# Patient Record
Sex: Male | Born: 1960 | Race: Black or African American | Hispanic: No | Marital: Married | Smoking: Former smoker
Health system: Southern US, Community
[De-identification: ages and names within clinical notes are randomized; demographics above are authoritative.]

## PROBLEM LIST (undated history)

## (undated) DIAGNOSIS — I82409 Acute embolism and thrombosis of unspecified deep veins of unspecified lower extremity: Secondary | ICD-10-CM

## (undated) DIAGNOSIS — K635 Polyp of colon: Secondary | ICD-10-CM

## (undated) DIAGNOSIS — I10 Essential (primary) hypertension: Secondary | ICD-10-CM

## (undated) DIAGNOSIS — A159 Respiratory tuberculosis unspecified: Secondary | ICD-10-CM

## (undated) DIAGNOSIS — K625 Hemorrhage of anus and rectum: Secondary | ICD-10-CM

## (undated) DIAGNOSIS — E785 Hyperlipidemia, unspecified: Secondary | ICD-10-CM

## (undated) HISTORY — DX: Essential (primary) hypertension: I10

## (undated) HISTORY — DX: Hemorrhage of anus and rectum: K62.5

## (undated) HISTORY — PX: PILONIDAL CYST EXCISION: SHX744

## (undated) SURGERY — Surgical Case
Anesthesia: *Unknown

---

## 2007-02-25 ENCOUNTER — Emergency Department (HOSPITAL_COMMUNITY): Admission: EM | Admit: 2007-02-25 | Discharge: 2007-02-25 | Payer: Self-pay | Admitting: Emergency Medicine

## 2011-08-08 ENCOUNTER — Other Ambulatory Visit (INDEPENDENT_AMBULATORY_CARE_PROVIDER_SITE_OTHER): Payer: Self-pay | Admitting: General Surgery

## 2011-08-08 ENCOUNTER — Ambulatory Visit (INDEPENDENT_AMBULATORY_CARE_PROVIDER_SITE_OTHER): Payer: BC Managed Care – PPO | Admitting: General Surgery

## 2011-08-08 ENCOUNTER — Encounter (INDEPENDENT_AMBULATORY_CARE_PROVIDER_SITE_OTHER): Payer: Self-pay | Admitting: General Surgery

## 2011-08-08 VITALS — BP 150/96 | HR 64 | Temp 97.8°F | Resp 12 | Ht 71.5 in | Wt 208.4 lb

## 2011-08-08 DIAGNOSIS — K4090 Unilateral inguinal hernia, without obstruction or gangrene, not specified as recurrent: Secondary | ICD-10-CM

## 2011-08-08 NOTE — Progress Notes (Signed)
Chief Complaint  Patient presents with  . Other    new pt- eval [facility]    HPI Joseph [name] is a 50 y.o. male.  Referred by Dr. Shanda Bumps [name] HPI [name] who is otherwise healthy except for he was started on some medication for hypertension recently. He comes in today after several weeks of some left groin pain and swelling. This is worse when he sneezes or coughs. This always goes away over some time. He has no trouble with his bowels although he does say that he has some occasional bright red blood per rectum. He has a history of a what sounds like a pilonidal cyst. He has not had a colonoscopy before he has no weight loss or any other symptoms.  Past Medical History  Diagnosis Date  . Hypertension   . Inguinal hernia     LIH  . Rectal bleeding   . Pilonidal cyst     Past Surgical History  Procedure Date  . Pilonidal cyst excision     History reviewed. No pertinent family history.  Social History History  Substance Use Topics  . Smoking status: Current Everyday Smoker -- 1.0 packs/day  . Smokeless tobacco: Not on file  . Alcohol Use: No    No Known Allergies  Current Outpatient Prescriptions  Medication Sig Dispense Refill  . LISINOPRIL-HYDROCHLOROTHIAZIDE PO Take by mouth daily.          Review of Systems Review of Systems  Gastrointestinal: Positive for blood in stool.  All other systems reviewed and are negative.    Blood pressure 150/96, pulse 64, temperature 97.8 F (36.6 C), temperature source Temporal, resp. rate 12, height 5' 11.5" (1.816 m), weight 208 lb 6.4 oz (94.53 kg).  Physical Exam Physical Exam  Constitutional: He appears well-developed and well-nourished.  Eyes: No scleral icterus.  Neck: Neck supple.  Cardiovascular: Normal rate, regular rhythm and normal heart sounds.   Pulmonary/Chest: Effort normal and breath sounds normal. He has no wheezes. He has no rales.  Abdominal: Soft. There is no tenderness. A hernia  is present. Hernia confirmed positive in the left inguinal area. Hernia confirmed negative in the ventral area and confirmed negative in the right inguinal area.  Genitourinary: Testes normal. Right testis shows no mass. Left testis shows no mass.     Assessment    Left inguinal hernia    Plan    We discussed observation versus repair.  We discussed an open inguinal hernia repair. I described the procedure in detail.  The patient was given educational material.  Goals should be achieved with surgery. We discussed the usage of mesh and the rationale behind that. We went over the pathophysiology of inguinal hernia. We have elected to perform open inguinal hernia repair with mesh.  We discussed the risks including bleeding, infection, recurrence, postoperative pain and chronic groin pain, testicular injury, urinary retention, numbness in groin and around incision.  I also recommended he undergo colonoscopy and we discussed doing this before the hernia repair. He wants to do hernia repair first.  I will refer to GI when done.       Joseph [name] 08/08/2011, 2:22 PM

## 2011-08-25 ENCOUNTER — Encounter (HOSPITAL_COMMUNITY): Payer: Self-pay

## 2011-08-25 ENCOUNTER — Encounter (HOSPITAL_COMMUNITY)
Admission: RE | Admit: 2011-08-25 | Discharge: 2011-08-25 | Disposition: A | Discharge status: Home / Self Care | Payer: BC Managed Care – PPO | Source: Ambulatory Visit | Attending: General Surgery | Admitting: General Surgery

## 2011-08-25 ENCOUNTER — Other Ambulatory Visit: Payer: Self-pay

## 2011-08-25 ENCOUNTER — Encounter (HOSPITAL_COMMUNITY)
Admission: RE | Admit: 2011-08-25 | Discharge: 2011-08-25 | Disposition: A | Discharge status: Home / Self Care | Payer: BC Managed Care – PPO | Source: Ambulatory Visit | Attending: Anesthesiology | Admitting: Anesthesiology

## 2011-08-25 HISTORY — DX: Respiratory tuberculosis unspecified: A15.9

## 2011-08-25 LAB — CBC
Hemoglobin: 13 g/dL (ref 13.0–17.0)
MCH: 29.7 pg (ref 26.0–34.0)
MCV: 92.2 fL (ref 78.0–100.0)
RBC: 4.37 MIL/uL (ref 4.22–5.81)
WBC: 5.3 10*3/uL (ref 4.0–10.5)

## 2011-08-25 LAB — BASIC METABOLIC PANEL
CO2: 29 mEq/L (ref 19–32)
GFR calc non Af Amer: >90 mL/min (ref >90)
Glucose, Bld: 96 mg/dL (ref 70–99)
Potassium: 3.9 mEq/L (ref 3.5–5.1)
Sodium: 141 mEq/L (ref 135–145)

## 2011-08-25 LAB — SURGICAL PCR SCREEN: MRSA, PCR: NEGATIVE

## 2011-08-25 MED ORDER — CEFAZOLIN SODIUM-DEXTROSE 2-3 GM-% IV SOLR: 2.0000 g | INTRAVENOUS | Status: DC

## 2011-08-25 NOTE — Pre-Procedure Instructions (Signed)
20 Joseph [name]  08/25/2011   Your procedure is scheduled on:  11/06/10  Report to [facility] at 530AM.  Call this number if you have problems the morning of surgery: [phone]   Remember:   Do not eat food:After Midnight.  Do not drink clear liquids: 4 Hours before arrival.  Take these medicines the morning of surgery with A SIP OF WATER: none   Do not wear jewelry, make-up or nail polish.  Do not wear lotions, powders, or perfumes. You may wear deodorant.  Do not shave 48 hours prior to surgery.  Do not bring valuables to the hospital.  Contacts, dentures or bridgework may not be worn into surgery.  Leave suitcase in the car. After surgery it may be brought to your room.  For patients admitted to the hospital, checkout time is 11:00 AM the day of discharge.   Patients discharged the day of surgery will not be allowed to drive home.  Name and phone number of your driver: dtr  [email or web address]  Special Instructions: CHG Shower Use Special Wash: 1/2 bottle night before surgery and 1/2 bottle morning of surgery.   Please read over the following fact sheets that you were given: Pain Booklet, Coughing and Deep Breathing, MRSA Information and Surgical Site Infection Prevention

## 2011-09-06 MED ORDER — CEFAZOLIN SODIUM-DEXTROSE 2-3 GM-% IV SOLR
2.0000 g | INTRAVENOUS | Status: DC
  Filled 2011-09-06: qty 50

## 2011-09-07 ENCOUNTER — Encounter (HOSPITAL_COMMUNITY)
Admission: RE | Disposition: A | Discharge status: Home / Self Care | Payer: Self-pay | Source: Ambulatory Visit | Attending: General Surgery

## 2011-09-07 ENCOUNTER — Ambulatory Visit (HOSPITAL_COMMUNITY)
Admission: RE | Admit: 2011-09-07 | Discharge: 2011-09-07 | Disposition: A | Discharge status: Home / Self Care | Payer: BC Managed Care – PPO | Source: Ambulatory Visit | Attending: General Surgery | Admitting: General Surgery

## 2011-09-07 ENCOUNTER — Encounter (HOSPITAL_COMMUNITY): Payer: Self-pay | Admitting: Anesthesiology

## 2011-09-07 ENCOUNTER — Encounter (HOSPITAL_COMMUNITY): Payer: Self-pay | Admitting: *Deleted

## 2011-09-07 ENCOUNTER — Ambulatory Visit (HOSPITAL_COMMUNITY): Payer: BC Managed Care – PPO | Admitting: Anesthesiology

## 2011-09-07 DIAGNOSIS — K4090 Unilateral inguinal hernia, without obstruction or gangrene, not specified as recurrent: Secondary | ICD-10-CM

## 2011-09-07 DIAGNOSIS — J45909 Unspecified asthma, uncomplicated: Secondary | ICD-10-CM | POA: Insufficient documentation

## 2011-09-07 DIAGNOSIS — F17200 Nicotine dependence, unspecified, uncomplicated: Secondary | ICD-10-CM | POA: Insufficient documentation

## 2011-09-07 DIAGNOSIS — K921 Melena: Secondary | ICD-10-CM | POA: Insufficient documentation

## 2011-09-07 DIAGNOSIS — I10 Essential (primary) hypertension: Secondary | ICD-10-CM | POA: Insufficient documentation

## 2011-09-07 HISTORY — PX: INGUINAL HERNIA REPAIR: SHX194

## 2011-09-07 SURGERY — REPAIR, HERNIA, INGUINAL, ADULT
Anesthesia: General | Site: Groin | Laterality: Left | Wound class: Clean

## 2011-09-07 MED ORDER — MORPHINE SULFATE 2 MG/ML IJ SOLN: 2.0000 mg | INTRAMUSCULAR | Status: DC | PRN

## 2011-09-07 MED ORDER — CEFAZOLIN SODIUM 1-5 GM-% IV SOLN
INTRAVENOUS | Status: DC | PRN
  Administered 2011-09-07: 2 g via INTRAVENOUS

## 2011-09-07 MED ORDER — OXYCODONE-ACETAMINOPHEN 5-325 MG PO TABS: 1.0000 | ORAL_TABLET | ORAL | Status: DC | PRN

## 2011-09-07 MED ORDER — SODIUM CHLORIDE 0.9 % IJ SOLN
INTRAMUSCULAR | Status: DC | PRN
  Administered 2011-09-07: 20 mL

## 2011-09-07 MED ORDER — BUPIVACAINE LIPOSOME 1.3 % IJ SUSP
INTRAMUSCULAR | Status: DC | PRN
  Administered 2011-09-07: 20 mL

## 2011-09-07 MED ORDER — ONDANSETRON HCL 4 MG/2ML IJ SOLN: 4.0000 mg | Freq: Once | INTRAMUSCULAR | Status: DC | PRN

## 2011-09-07 MED ORDER — HYDROMORPHONE HCL PF 1 MG/ML IJ SOLN
INTRAMUSCULAR | Status: AC
  Filled 2011-09-07: qty 1

## 2011-09-07 MED ORDER — SODIUM CHLORIDE 0.9 % IV SOLN: INTRAVENOUS | Status: DC

## 2011-09-07 MED ORDER — MIDAZOLAM HCL 5 MG/5ML IJ SOLN
INTRAMUSCULAR | Status: DC | PRN
  Administered 2011-09-07: 2 mg via INTRAVENOUS

## 2011-09-07 MED ORDER — ONDANSETRON HCL 4 MG/2ML IJ SOLN
4.0000 mg | Freq: Once | INTRAMUSCULAR | Status: AC
  Administered 2011-09-07: 4 mg via INTRAVENOUS

## 2011-09-07 MED ORDER — SODIUM CHLORIDE 0.9 % IR SOLN
Status: DC | PRN
  Administered 2011-09-07: 1

## 2011-09-07 MED ORDER — BUPIVACAINE LIPOSOME 1.3 % IJ SUSP
20.0000 mL | INTRAMUSCULAR | Status: DC
  Filled 2011-09-07 (×2): qty 20

## 2011-09-07 MED ORDER — ROCURONIUM BROMIDE 100 MG/10ML IV SOLN
INTRAVENOUS | Status: DC | PRN
  Administered 2011-09-07: 50 mg via INTRAVENOUS

## 2011-09-07 MED ORDER — HYDROMORPHONE HCL PF 1 MG/ML IJ SOLN
0.2500 mg | INTRAMUSCULAR | Status: DC | PRN
  Administered 2011-09-07 (×3): 0.5 mg via INTRAVENOUS

## 2011-09-07 MED ORDER — OXYCODONE-ACETAMINOPHEN 5-325 MG PO TABS: 1.0000 | ORAL_TABLET | ORAL | Status: AC | PRN

## 2011-09-07 MED ORDER — NEOSTIGMINE METHYLSULFATE 1 MG/ML IJ SOLN
INTRAMUSCULAR | Status: DC | PRN
  Administered 2011-09-07: 3 mg via INTRAVENOUS

## 2011-09-07 MED ORDER — FENTANYL CITRATE 0.05 MG/ML IJ SOLN
INTRAMUSCULAR | Status: DC | PRN
  Administered 2011-09-07: 150 ug via INTRAVENOUS
  Administered 2011-09-07 (×2): 50 ug via INTRAVENOUS

## 2011-09-07 MED ORDER — PROPOFOL 10 MG/ML IV EMUL
INTRAVENOUS | Status: DC | PRN
  Administered 2011-09-07: 300 mg via INTRAVENOUS

## 2011-09-07 MED ORDER — HYDROMORPHONE HCL PF 1 MG/ML IJ SOLN: 0.2500 mg | INTRAMUSCULAR | Status: DC | PRN

## 2011-09-07 MED ORDER — GLYCOPYRROLATE 0.2 MG/ML IJ SOLN
INTRAMUSCULAR | Status: DC | PRN
  Administered 2011-09-07: .4 mg via INTRAVENOUS

## 2011-09-07 MED ORDER — LACTATED RINGERS IV SOLN
INTRAVENOUS | Status: DC | PRN
  Administered 2011-09-07: 07:00:00 via INTRAVENOUS

## 2011-09-07 MED ORDER — ONDANSETRON HCL 4 MG/2ML IJ SOLN
4.0000 mg | Freq: Once | INTRAMUSCULAR | Status: DC | PRN
  Filled 2011-09-07: qty 2

## 2011-09-07 MED ORDER — ONDANSETRON HCL 4 MG/2ML IJ SOLN
INTRAMUSCULAR | Status: DC | PRN
  Administered 2011-09-07: 4 mg via INTRAVENOUS

## 2011-09-07 MED ORDER — SODIUM CHLORIDE 0.9 % IJ SOLN
3.0000 mL | INTRAMUSCULAR | Status: DC | PRN
  Administered 2011-09-07: 3 mL via INTRAVENOUS

## 2011-09-07 SURGICAL SUPPLY — 49 items
ADH SKN CLS APL DERMABOND .7 (GAUZE/BANDAGES/DRESSINGS)
BLADE SURG 10 STRL SS (BLADE) ×2 IMPLANT
BLADE SURG 15 STRL LF DISP TIS (BLADE) ×1 IMPLANT
BLADE SURG 15 STRL SS (BLADE) ×2
BLADE SURG ROTATE 9660 (MISCELLANEOUS) ×2 IMPLANT
CHLORAPREP W/TINT 26ML (MISCELLANEOUS) ×2 IMPLANT
CLOTH BEACON ORANGE TIMEOUT ST (SAFETY) ×2 IMPLANT
COVER SURGICAL LIGHT HANDLE (MISCELLANEOUS) ×2 IMPLANT
DERMABOND ADVANCED (GAUZE/BANDAGES/DRESSINGS)
DERMABOND ADVANCED .7 DNX12 (GAUZE/BANDAGES/DRESSINGS) ×1 IMPLANT
DRAIN PENROSE 1/2X12 LTX STRL (WOUND CARE) ×1 IMPLANT
DRAPE LAPAROTOMY TRNSV 102X78 (DRAPE) ×2 IMPLANT
ELECT CAUTERY BLADE 6.4 (BLADE) ×2 IMPLANT
ELECT REM PT RETURN 9FT ADLT (ELECTROSURGICAL) ×2
ELECTRODE REM PT RTRN 9FT ADLT (ELECTROSURGICAL) ×1 IMPLANT
GAUZE SPONGE 4X4 16PLY XRAY LF (GAUZE/BANDAGES/DRESSINGS) ×2 IMPLANT
GLOVE BIO SURGEON STRL SZ7 (GLOVE) ×2 IMPLANT
GLOVE BIO SURGEON STRL SZ7.5 (GLOVE) ×2 IMPLANT
GLOVE BIOGEL PI IND STRL 7.0 (GLOVE) IMPLANT
GLOVE BIOGEL PI IND STRL 7.5 (GLOVE) ×1 IMPLANT
GLOVE BIOGEL PI INDICATOR 7.0 (GLOVE) ×1
GLOVE BIOGEL PI INDICATOR 7.5 (GLOVE) ×2
GLOVE SURG SS PI 6.5 STRL IVOR (GLOVE) ×2 IMPLANT
GOWN STRL NON-REIN LRG LVL3 (GOWN DISPOSABLE) ×5 IMPLANT
KIT BASIN OR (CUSTOM PROCEDURE TRAY) ×2 IMPLANT
KIT ROOM TURNOVER OR (KITS) ×2 IMPLANT
MESH HERNIA SYS ULTRAPRO LRG (Mesh General) ×1 IMPLANT
NDL HYPO 25GX1X1/2 BEV (NEEDLE) ×1 IMPLANT
NEEDLE HYPO 25GX1X1/2 BEV (NEEDLE) ×2 IMPLANT
NS IRRIG 1000ML POUR BTL (IV SOLUTION) ×2 IMPLANT
PACK SURGICAL SETUP 50X90 (CUSTOM PROCEDURE TRAY) ×2 IMPLANT
PAD ARMBOARD 7.5X6 YLW CONV (MISCELLANEOUS) ×2 IMPLANT
PENCIL BUTTON HOLSTER BLD 10FT (ELECTRODE) ×2 IMPLANT
SPONGE LAP 18X18 X RAY DECT (DISPOSABLE) ×2 IMPLANT
SUT MNCRL AB 4-0 PS2 18 (SUTURE) ×2 IMPLANT
SUT PROLENE 2 0 CT2 30 (SUTURE) ×5 IMPLANT
SUT VIC AB 0 CT1 27 (SUTURE)
SUT VIC AB 0 CT1 27XBRD ANBCTR (SUTURE) IMPLANT
SUT VIC AB 2-0 CT1 27 (SUTURE) ×4
SUT VIC AB 2-0 CT1 TAPERPNT 27 (SUTURE) ×2 IMPLANT
SUT VIC AB 3-0 SH 27 (SUTURE) ×2
SUT VIC AB 3-0 SH 27XBRD (SUTURE) ×1 IMPLANT
SUT VICRYL AB 2 0 TIES (SUTURE) ×2 IMPLANT
SYR CONTROL 10ML LL (SYRINGE) ×2 IMPLANT
TOWEL OR 17X24 6PK STRL BLUE (TOWEL DISPOSABLE) ×2 IMPLANT
TOWEL OR 17X26 10 PK STRL BLUE (TOWEL DISPOSABLE) ×2 IMPLANT
TUBE CONNECTING 12X1/4 (SUCTIONS) ×2 IMPLANT
WATER STERILE IRR 1000ML POUR (IV SOLUTION) IMPLANT
YANKAUER SUCT BULB TIP NO VENT (SUCTIONS) ×2 IMPLANT

## 2011-09-07 NOTE — Anesthesia Postprocedure Evaluation (Signed)
  Anesthesia Post-op Note  Patient: Joseph [name]  Procedure(s) Performed:  HERNIA REPAIR INGUINAL ADULT  Patient Location: PACU  Anesthesia Type: General  Level of Consciousness: awake, oriented, sedated and patient cooperative  Airway and Oxygen Therapy: Patient Spontanous Breathing and Patient connected to nasal cannula oxygen  Post-op Pain: mild  Post-op Assessment: Post-op Vital signs reviewed, Patient's Cardiovascular Status Stable, Respiratory Function Stable, Patent Airway, No signs of Nausea or vomiting and Pain level controlled  Post-op Vital Signs: stable  Complications: No apparent anesthesia complications

## 2011-09-07 NOTE — Transfer of Care (Signed)
Immediate Anesthesia Transfer of Care Note  Patient: Joseph [name]  Procedure(s) Performed:  HERNIA REPAIR INGUINAL ADULT  Patient Location: PACU  Anesthesia Type: General  Level of Consciousness: awake, alert  and oriented  Airway & Oxygen Therapy: Patient Spontanous Breathing and Patient connected to nasal cannula oxygen  Post-op Assessment: Report given to PACU RN and Post -op Vital signs reviewed and stable  Post vital signs: Reviewed and stable  Complications: No apparent anesthesia complications

## 2011-09-07 NOTE — Interval H&P Note (Signed)
History and Physical Interval Note:  09/07/2011 7:08 AM  Joseph [name]  has presented today for surgery, with the diagnosis of Left inguinal hernia  The various methods of treatment have been discussed with the patient and family. After consideration of risks, benefits and other options for treatment, the patient has consented to  Procedure(s): HERNIA REPAIR INGUINAL as a surgical intervention .  The patients' history has been reviewed, patient examined, no change in status, stable for surgery.  I have reviewed the patients' chart and labs.  Questions were answered to the patient's satisfaction.     Sahory Nordling

## 2011-09-07 NOTE — Preoperative (Signed)
Beta Blockers   Reason not to administer Beta Blockers:Not Applicable 

## 2011-09-07 NOTE — H&P (View-Only) (Signed)
Chief Complaint  Patient presents with  . Other    new pt- eval [facility]    HPI Joseph [name] is a 50 y.o. male.  Referred by Dr. Jessica Copland HPI This is a 50-year-old male who is otherwise healthy except for he was started on some medication for hypertension recently. He comes in today after several weeks of some left groin pain and swelling. This is worse when he sneezes or coughs. This always goes away over some time. He has no trouble with his bowels although he does say that he has some occasional bright red blood per rectum. He has a history of a what sounds like a pilonidal cyst. He has not had a colonoscopy before he has no weight loss or any other symptoms.  Past Medical History  Diagnosis Date  . Hypertension   . Inguinal hernia     LIH  . Rectal bleeding   . Pilonidal cyst     Past Surgical History  Procedure Date  . Pilonidal cyst excision     History reviewed. No pertinent family history.  Social History History  Substance Use Topics  . Smoking status: Current Everyday Smoker -- 1.0 packs/day  . Smokeless tobacco: Not on file  . Alcohol Use: No    No Known Allergies  Current Outpatient Prescriptions  Medication Sig Dispense Refill  . LISINOPRIL-HYDROCHLOROTHIAZIDE PO Take by mouth daily.          Review of Systems Review of Systems  Gastrointestinal: Positive for blood in stool.  All other systems reviewed and are negative.    Blood pressure 150/96, pulse 64, temperature 97.8 F (36.6 C), temperature source Temporal, resp. rate 12, height 5' 11.5" (1.816 m), weight 208 lb 6.4 oz (94.53 kg).  Physical Exam Physical Exam  Constitutional: He appears well-developed and well-nourished.  Eyes: No scleral icterus.  Neck: Neck supple.  Cardiovascular: Normal rate, regular rhythm and normal heart sounds.   Pulmonary/Chest: Effort normal and breath sounds normal. He has no wheezes. He has no rales.  Abdominal: Soft. There is no tenderness. A hernia  is present. Hernia confirmed positive in the left inguinal area. Hernia confirmed negative in the ventral area and confirmed negative in the right inguinal area.  Genitourinary: Testes normal. Right testis shows no mass. Left testis shows no mass.     Assessment    Left inguinal hernia    Plan    We discussed observation versus repair.  We discussed an open inguinal hernia repair. I described the procedure in detail.  The patient was given educational material.  Goals should be achieved with surgery. We discussed the usage of mesh and the rationale behind that. We went over the pathophysiology of inguinal hernia. We have elected to perform open inguinal hernia repair with mesh.  We discussed the risks including bleeding, infection, recurrence, postoperative pain and chronic groin pain, testicular injury, urinary retention, numbness in groin and around incision.  I also recommended he undergo colonoscopy and we discussed doing this before the hernia repair. He wants to do hernia repair first.  I will refer to GI when done.       Marcin Holte 08/08/2011, 2:22 PM    

## 2011-09-07 NOTE — Anesthesia Preprocedure Evaluation (Addendum)
Anesthesia Evaluation  Patient identified by MRN, date of birth, ID band Patient awake    Reviewed: Allergy & Precautions, H&P , NPO status , Patient's Chart, lab work & pertinent test results  History of Anesthesia Complications Negative for: history of anesthetic complications  Airway Mallampati: II TM Distance: >3 FB Neck ROM: full    Dental  (+) Teeth Intact and Dental Advidsory Given   Pulmonary asthma , Current Smoker,    Pulmonary exam normal       Cardiovascular Exercise Tolerance: Good hypertension, Pt. on medications regular Normal    Neuro/Psych Negative Neurological ROS  Negative Psych ROS   GI/Hepatic negative GI ROS, Neg liver ROS,   Endo/Other  Negative Endocrine ROS  Renal/GU negative Renal ROS  Genitourinary negative   Musculoskeletal negative musculoskeletal ROS (+)   Abdominal   Peds negative pediatric ROS (+)  Hematology negative hematology ROS (+)   Anesthesia Other Findings   Reproductive/Obstetrics negative OB ROS                        Anesthesia Physical Anesthesia Plan  ASA: II  Anesthesia Plan: General   Post-op Pain Management:    Induction: Intravenous  Airway Management Planned: Oral ETT  Additional Equipment:   Intra-op Plan:   Post-operative Plan: Extubation in OR  Informed Consent: I have reviewed the patients History and Physical, chart, labs and discussed the procedure including the risks, benefits and alternatives for the proposed anesthesia with the patient or authorized representative who has indicated his/her understanding and acceptance.   Dental advisory given and Dental Advisory Given  Plan Discussed with: Anesthesiologist, CRNA and Surgeon  Anesthesia Plan Comments:       Anesthesia Quick Evaluation

## 2011-09-07 NOTE — Op Note (Signed)
Preoperative diagnosis: Left inguinal hernia  Postoperative diagnosis: Left indirect inguinal hernia  Procedure: Left inguinal hernia repair with Ultra Pro hernia system  Surgeon: Dr. Harden Mo  Anesthesia: Gen Endotracheal  Specimens: None  Drains: None  Estimated blood loss: Minimal  Sponge and needle counts were correct x2 at the completion of the operation  Complications: None  Indications: [name] with a symptomatic left groin hernia. We discussed an open left inguinal hernia with mesh. The risks and benefits were discussed prior to beginning.  Procedure: After informed consent was obtained the patient was taken to the operating room. He was administered 1 g of intravenous cefazolin. Sequential compression devices were placed on his lower extremities prior to beginning the operation. He was then placed under general anesthesia without complication. His left groin was then prepped and draped in the standard sterile surgical fashion. A surgical timeout was then performed.  A left groin incision was made. The superficial epigastric vein was cauterized. This was taken down to his external abdominal oblique. This was opened through its external ring. His spermatic cord was then encircled with a Penrose drain. He had a weak floor but was noted to have a fairly large indirect hernia. There was a fairly large hernia sac as well as a cord lipoma associated with this. This was separated from the remainder of the cord structures preserving those in their entirety. These were then reduced inside the abdomen. The preperitoneal space was then developed with a Ray-Tec sponge. I then used an UltraPower hernia system and placed this into the preperitoneal space. The bottom portion of the bilayer was laid flat. I then closed the ring with 2-0 Vicryl suture. The top portion of the bilayer was then laid flat. At T-cut was made in the mesh and wrapped around the spermatic cord. I then  sutured this in numerous positions with 2-0 Prolene suture including to the pubic tubercle as well as to the shelving edge. The cheek it was tacked together as well as to the shelving edge. The lateral portion was laid flat under the external abdominal oblique. The mesh was in position and completely obliterated the myopectineal orifice. Hemostasis was observed. I then closed the external oblique with 2-0 Vicryl. Scarpa's fascia was closed with 3-0 Vicryl. The skin was closed with 4-0 Monocryl in a subcuticular fashion. I then infiltrated a total of 40 cc Exparel. Dermabond was then placed over the wound. His testicles in the scrotum at the completion of the operation. He tolerated this well, was extubated in the operating room, and transferred to the recovery room in stable condition.

## 2011-09-08 ENCOUNTER — Encounter (HOSPITAL_COMMUNITY): Payer: Self-pay | Admitting: General Surgery

## 2011-09-08 ENCOUNTER — Telehealth (INDEPENDENT_AMBULATORY_CARE_PROVIDER_SITE_OTHER): Payer: Self-pay | Admitting: General Surgery

## 2011-09-08 NOTE — Telephone Encounter (Signed)
This is Dr Doreen Salvage patient and is in his box awaiting his signature.

## 2011-09-08 NOTE — Telephone Encounter (Signed)
Message copied by Liliana Cline on Fri Sep 08, 2011  1:20 PM ------      Message from: Cathi Roan      Created: Fri Sep 08, 2011 11:58 AM      Regarding: FMLA paper work       Patient called with concerns about length of time it is taking to get his FMLA. Linda's notes say they have been in Dr. Tawana Scale box since 08/25/11

## 2011-10-12 ENCOUNTER — Encounter (INDEPENDENT_AMBULATORY_CARE_PROVIDER_SITE_OTHER): Payer: Self-pay | Admitting: General Surgery

## 2011-10-12 ENCOUNTER — Ambulatory Visit (INDEPENDENT_AMBULATORY_CARE_PROVIDER_SITE_OTHER): Payer: BC Managed Care – PPO | Admitting: General Surgery

## 2011-10-12 VITALS — BP 180/110 | HR 72 | Temp 97.6°F | Resp 16 | Ht 71.5 in | Wt 210.4 lb

## 2011-10-12 DIAGNOSIS — Z09 Encounter for follow-up examination after completed treatment for conditions other than malignant neoplasm: Secondary | ICD-10-CM

## 2011-10-12 NOTE — Progress Notes (Signed)
Subjective:     Patient ID: Joseph [name], male   DOB: June 25, 1961, 51 y.o.   MRN: [id number]  HPI This is a 51 year old male who I did a left inguinal hernia repair about a month ago now. He's doing well without any significant complaints. His back the most of his normal activities. He is due to go back to work on the 10th.  Review of Systems     Objective:   Physical Exam    well healed left groin incision without infection Assessment:     S/p [facility]H    Plan:      He is back to work on the 10th with restrictions for an additional 2 weeks. I told him that the scar will smooth out over some time and that his discomfort that he has occasionally will also get better. Ice and to call me back if he has any other questions otherwise I will see him as needed.

## 2016-05-26 ENCOUNTER — Encounter (HOSPITAL_BASED_OUTPATIENT_CLINIC_OR_DEPARTMENT_OTHER): Payer: Self-pay

## 2016-05-26 ENCOUNTER — Ambulatory Visit (HOSPITAL_BASED_OUTPATIENT_CLINIC_OR_DEPARTMENT_OTHER): Admission: RE | Admit: 2016-05-26 | Payer: BC Managed Care – PPO | Source: Ambulatory Visit

## 2016-05-26 ENCOUNTER — Encounter: Payer: Self-pay | Admitting: Physician Assistant

## 2016-05-26 ENCOUNTER — Ambulatory Visit (HOSPITAL_BASED_OUTPATIENT_CLINIC_OR_DEPARTMENT_OTHER)
Admission: RE | Admit: 2016-05-26 | Discharge: 2016-05-26 | Disposition: A | Discharge status: Home / Self Care | Payer: BC Managed Care – PPO | Source: Ambulatory Visit | Attending: Physician Assistant | Admitting: Physician Assistant

## 2016-05-26 ENCOUNTER — Encounter (HOSPITAL_BASED_OUTPATIENT_CLINIC_OR_DEPARTMENT_OTHER): Admission: RE | Admit: 2016-05-26 | Payer: BC Managed Care – PPO | Source: Ambulatory Visit

## 2016-05-26 ENCOUNTER — Ambulatory Visit (INDEPENDENT_AMBULATORY_CARE_PROVIDER_SITE_OTHER): Payer: BC Managed Care – PPO | Admitting: Physician Assistant

## 2016-05-26 VITALS — BP 214/120 | HR 70 | Temp 97.8°F | Resp 16 | Ht 70.0 in | Wt 210.6 lb

## 2016-05-26 DIAGNOSIS — I10 Essential (primary) hypertension: Secondary | ICD-10-CM

## 2016-05-26 DIAGNOSIS — M160 Bilateral primary osteoarthritis of hip: Secondary | ICD-10-CM | POA: Diagnosis not present

## 2016-05-26 DIAGNOSIS — I700 Atherosclerosis of aorta: Secondary | ICD-10-CM | POA: Diagnosis not present

## 2016-05-26 DIAGNOSIS — R1030 Lower abdominal pain, unspecified: Secondary | ICD-10-CM

## 2016-05-26 DIAGNOSIS — R1032 Left lower quadrant pain: Secondary | ICD-10-CM

## 2016-05-26 DIAGNOSIS — N281 Cyst of kidney, acquired: Secondary | ICD-10-CM | POA: Diagnosis not present

## 2016-05-26 DIAGNOSIS — Z0000 Encounter for general adult medical examination without abnormal findings: Secondary | ICD-10-CM | POA: Diagnosis not present

## 2016-05-26 DIAGNOSIS — R079 Chest pain, unspecified: Secondary | ICD-10-CM

## 2016-05-26 DIAGNOSIS — R0602 Shortness of breath: Secondary | ICD-10-CM | POA: Diagnosis not present

## 2016-05-26 DIAGNOSIS — K429 Umbilical hernia without obstruction or gangrene: Secondary | ICD-10-CM | POA: Diagnosis not present

## 2016-05-26 LAB — POCT CBC
GRANULOCYTE PERCENT: 47.9 % (ref 37–80)
HEMATOCRIT: 38.8 % — AB (ref 43.5–53.7)
Hemoglobin: 13.6 g/dL — AB (ref 14.1–18.1)
LYMPH, POC: 2 (ref 0.6–3.4)
MCH, POC: 30.5 pg (ref 27–31.2)
MCHC: 35 g/dL (ref 31.8–35.4)
MCV: 87 fL (ref 80–97)
MID (CBC): 0.2 (ref 0–0.9)
MPV: 8.8 fL (ref 0–99.8)
POC GRANULOCYTE: 2.1 (ref 2–6.9)
POC LYMPH %: 46.3 % (ref 10–50)
POC MID %: 5.8 %M (ref 0–12)
Platelet Count, POC: 187 10*3/uL (ref 142–424)
RBC: 4.46 M/uL — AB (ref 4.69–6.13)
RDW, POC: 12.9 %
WBC: 4.3 10*3/uL — AB (ref 4.6–10.2)

## 2016-05-26 LAB — POCT URINALYSIS DIP (MANUAL ENTRY)
BILIRUBIN UA: NEGATIVE
Bilirubin, UA: NEGATIVE
Blood, UA: NEGATIVE
GLUCOSE UA: NEGATIVE
LEUKOCYTES UA: NEGATIVE
Nitrite, UA: NEGATIVE
PROTEIN UA: NEGATIVE
SPEC GRAV UA: 1.025
UROBILINOGEN UA: 0.2
pH, UA: 5.5

## 2016-05-26 LAB — POC MICROSCOPIC URINALYSIS (UMFC): MUCUS RE: ABSENT

## 2016-05-26 LAB — GLUCOSE, POCT (MANUAL RESULT ENTRY): POC Glucose: 101 mg/dl — AB (ref 70–99)

## 2016-05-26 MED ORDER — LISINOPRIL-HYDROCHLOROTHIAZIDE 20-12.5 MG PO TABS: 1.0000 | ORAL_TABLET | Freq: Every day | ORAL | 1 refills | Status: DC

## 2016-05-26 MED ORDER — IOPAMIDOL (ISOVUE-300) INJECTION 61%
100.0000 mL | Freq: Once | INTRAVENOUS | Status: AC | PRN
  Administered 2016-05-26: 100 mL via INTRAVENOUS

## 2016-05-26 NOTE — Patient Instructions (Addendum)
Please report to [facility] located at [address] [place] [address] at 330pm. You may enter the building at the main entrance on the 1st floor.     Please await contact for cardiology consult.  I would like you to return in 2 weeks for recheck your blood pressure. I will contact you with the results of your ultrasound of your groin. DASH Eating Plan DASH stands for "Dietary Approaches to Stop Hypertension." The DASH eating plan is a healthy eating plan that has been shown to reduce high blood pressure (hypertension). Additional health benefits may include reducing the risk of type 2 diabetes mellitus, heart disease, and stroke. The DASH eating plan may also help with weight loss. WHAT DO I NEED TO KNOW ABOUT THE DASH EATING PLAN? For the DASH eating plan, you will follow these general guidelines:  Choose foods with a percent daily value for sodium of less than 5% (as listed on the food label).  Use salt-free seasonings or herbs instead of table salt or sea salt.  Check with your health care provider or pharmacist before using salt substitutes.  Eat lower-sodium products, often labeled as "lower sodium" or "no salt added."  Eat fresh foods.  Eat more vegetables, fruits, and low-fat dairy products.  Choose whole grains. Look for the word "whole" as the first word in the ingredient list.  Choose fish and skinless chicken or [place] more often than red meat. Limit fish, poultry, and meat to 6 oz (170 g) each day.  Limit sweets, desserts, sugars, and sugary drinks.  Choose heart-healthy fats.  Limit cheese to 1 oz (28 g) per day.  Eat more home-cooked food and less restaurant, buffet, and fast food.  Limit fried foods.  Cook foods using methods other than frying.  Limit canned vegetables. If you do use them, rinse them well to decrease the sodium.  When eating at a restaurant, ask that your food be prepared with less salt, or no salt if possible. WHAT FOODS  CAN I EAT? Seek help from a dietitian for individual calorie needs. Grains Whole grain or whole wheat bread. Brown rice. Whole grain or whole wheat pasta. Quinoa, bulgur, and whole grain cereals. Low-sodium cereals. Corn or whole wheat flour tortillas. Whole grain cornbread. Whole grain crackers. Low-sodium crackers. Vegetables Fresh or frozen vegetables (raw, steamed, roasted, or grilled). Low-sodium or reduced-sodium tomato and vegetable juices. Low-sodium or reduced-sodium tomato sauce and paste. Low-sodium or reduced-sodium canned vegetables.  Fruits All fresh, canned (in natural juice), or frozen fruits. Meat and Other Protein Products Ground beef (85% or leaner), grass-fed beef, or beef trimmed of fat. Skinless chicken or [place]. Ground chicken or [place]. Pork trimmed of fat. All fish and seafood. Eggs. Dried beans, peas, or lentils. Unsalted nuts and seeds. Unsalted canned beans. Dairy Low-fat dairy products, such as skim or 1% milk, 2% or reduced-fat cheeses, low-fat ricotta or cottage cheese, or plain low-fat yogurt. Low-sodium or reduced-sodium cheeses. Fats and Oils Tub margarines without trans fats. Light or reduced-fat mayonnaise and salad dressings (reduced sodium). Avocado. Safflower, olive, or canola oils. Natural peanut or almond butter. Other Unsalted popcorn and pretzels. The items listed above may not be a complete list of recommended foods or beverages. Contact your dietitian for more options. WHAT FOODS ARE NOT RECOMMENDED? Grains White bread. White pasta. White rice. Refined cornbread. Bagels and croissants. Crackers that contain trans fat. Vegetables Creamed or fried vegetables. Vegetables in a cheese sauce. Regular canned vegetables. Regular canned  tomato sauce and paste. Regular tomato and vegetable juices. Fruits Dried fruits. Canned fruit in light or heavy syrup. Fruit juice. Meat and Other Protein Products Fatty cuts of meat. Ribs, chicken wings, bacon, sausage,  bologna, salami, chitterlings, fatback, hot dogs, bratwurst, and packaged luncheon meats. Salted nuts and seeds. Canned beans with salt. Dairy Whole or 2% milk, cream, half-and-half, and cream cheese. Whole-fat or sweetened yogurt. Full-fat cheeses or blue cheese. Nondairy creamers and whipped toppings. Processed cheese, cheese spreads, or cheese curds. Condiments Onion and garlic salt, seasoned salt, table salt, and sea salt. Canned and packaged gravies. Worcestershire sauce. Tartar sauce. Barbecue sauce. Teriyaki sauce. Soy sauce, including reduced sodium. Steak sauce. Fish sauce. Oyster sauce. Cocktail sauce. Horseradish. Ketchup and mustard. Meat flavorings and tenderizers. Bouillon cubes. Hot sauce. Tabasco sauce. Marinades. Taco seasonings. Relishes. Fats and Oils Butter, stick margarine, lard, shortening, ghee, and bacon fat. Coconut, palm kernel, or palm oils. Regular salad dressings. Other Pickles and olives. Salted popcorn and pretzels. The items listed above may not be a complete list of foods and beverages to avoid. Contact your dietitian for more information. WHERE CAN I FIND MORE INFORMATION? National Heart, Lung, and Blood Institute: travelstabloid.com   This information is not intended to replace advice given to you by your health care provider. Make sure you discuss any questions you have with your health care provider.   Document Released: 09/14/2011 Document Revised: 10/16/2014 Document Reviewed: 07/30/2013 Elsevier Interactive Patient Education 2016 [employer].     IF you received an x-ray today, you will receive an invoice from [facility]. Please contact [facility] at [phone] with questions or concerns regarding your invoice.   IF you received labwork today, you will receive an invoice from [employer]. Please contact [facility] at [phone] with questions or concerns regarding your  invoice.   Our billing staff will not be able to assist you with questions regarding bills from these companies.  You will be contacted with the lab results as soon as they are available. The fastest way to get your results is to activate your My Chart account. Instructions are located on the last page of this paperwork. If you have not heard from [place] regarding the results in 2 weeks, please contact this office.

## 2016-05-26 NOTE — Progress Notes (Signed)
u

## 2016-05-26 NOTE — Progress Notes (Signed)
Patient ID: Joseph [name], male   DOB: September 29, 1961, 55 y.o.   MRN: [id number] [facility] [address] [place] [address] [phone]  Date:  05/26/2016   Name:  Joseph [name]   DOB:  25-May-1961   MRN:  [id number]  PCP:  No PCP Per Patient   By signing my name below, I, Ladene Artist, attest that this documentation has been prepared under the direction and in the presence of Ivar Drape, PA-C Electronically Signed: Ladene Artist, ED Scribe 05/26/2016 at 11:03 AM.  History of Present Illness:  [name] is a 55 y.o. male, with a h/o HTN, patient who presents to [facility] for an annual exam. Pt's triage BP was 198/122, repeat BP was 214/120. Pt has been prescribed lisinopril-HCTZ but admits that he is not complaint. He states that he drinks approximately 5-6 water bottles daily. He reports associated symptoms of frequent right-sided HAs, recurrent left sided chest pain in the morning while getting ready for work, intermittent and some blurred vision. He denies palpitations, skin changes, leg swelling, diarrhea, diaphoresis, nausea, tremors, dizziness, numbness/tingling. No h/o thyroid disease. Pt states that he does not consume canned foods or sodas often. 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 Pt has 2 step daughters.   Pt also presents with constant left-sided groin pain for the past 5 years since he had a left inguinal hernia repaired. He reports increased pain with bending, lifting and turning. Pt states that pain occasionally radiates into his left leg and testicles but is improved with lifting his testicles. He denies bladder incontinence, leaking and penile discharge.   Patient Active Problem List   Diagnosis Date Noted   Inguinal hernia unilateral, non-recurrent 08/08/2011    Past Medical History:  Diagnosis Date   Asthma    child    Hypertension    Inguinal hernia    LIH   Pilonidal cyst    Rectal bleeding    Tuberculosis    tb  dx 3-4    Past Surgical History:  Procedure Laterality Date   INGUINAL HERNIA REPAIR  09/07/2011   Procedure: HERNIA REPAIR INGUINAL ADULT;  Surgeon: Rolm Bookbinder, MD;  Location: [facility] OR;  Service: General;  Laterality: Left;   PILONIDAL CYST EXCISION      Social History  Substance Use Topics   Smoking status: Current Every Day Smoker    Packs/day: 0.50   Smokeless tobacco: Not on file   Alcohol use Yes    No family history on file.  No Known Allergies  Medication list has been reviewed and updated.  Current Outpatient Prescriptions on File Prior to Visit  Medication Sig Dispense Refill   LISINOPRIL-HYDROCHLOROTHIAZIDE PO Take by mouth daily.       No current facility-administered medications on file prior to visit.     Review of Systems  Constitutional: Negative for chills, diaphoresis and fever.  HENT: Negative for ear discharge, ear pain and sore throat.   Eyes: Negative for blurred vision and double vision.  Respiratory: Negative for cough, shortness of breath and wheezing.   Cardiovascular: Positive for chest pain. Negative for palpitations and leg swelling.  Gastrointestinal: Negative for diarrhea, nausea and vomiting.  Genitourinary: Negative for dysuria, frequency and hematuria.       +Groin pain  Skin: Negative for itching and rash.  Neurological: Positive for headaches. Negative for dizziness,  tingling and tremors.    Physical Examination: BP (!) 214/120    Pulse 70    Temp 97.8 F (36.6 C) (Oral)    Resp 16    Ht 5\' 10"  (1.778 m)    Wt 210 lb 9.6 oz (95.5 kg)    SpO2 99%    BMI 30.22 kg/m  Ideal Body Weight: @FLOWAMB [id number]  Physical Exam  Constitutional: He is oriented to person, place, and time. He appears well-developed and well-nourished. No distress.  HENT:  Head: Normocephalic and atraumatic.  Right Ear: Tympanic  membrane, external ear and ear canal normal.  Left Ear: Tympanic membrane, external ear and ear canal normal.  Eyes: Conjunctivae and EOM are normal. Pupils are equal, round, and reactive to light.  Cardiovascular: Normal rate, regular rhythm and normal pulses.  Exam reveals no gallop and no friction rub.   No murmur heard. Pulmonary/Chest: Effort normal and breath sounds normal. No respiratory distress. He has no wheezes. He has no rhonchi. He has no rales.  Abdominal: Soft. Bowel sounds are normal. He exhibits no distension and no mass. There is no tenderness.  Genitourinary: Penis normal.  Genitourinary Comments: Chaperone present. Tenderness at the L groin with some nodularity present. No erythema. Substantial unevenness where the testicle of the L side was much lower than R side however cremasteric reflex was intact.   Musculoskeletal: Normal range of motion. He exhibits no edema or tenderness.  Neurological: He is alert and oriented to person, place, and time. He displays normal reflexes.  Skin: Skin is warm and dry. He is not diaphoretic.  Psychiatric: He has a normal mood and affect. His behavior is normal.    Assessment and Plan: Joseph [name] is a 55 y.o. male who is here today for annual physical exam and left sided groin pain. CT unremarkable at this time to contribute to the current left sided symptoms.  At this time general surgery consult is warranted as initial pain is at the left groin consistent with hernia repair.  Spoke with radiology, who states that there was nothing consistent with scrotal pain.  Ultrasound was cancelled secondary to their suggestion, however this may be found to be needed.   Referral to cardiology advised at this time.   Benign essential HTN - Plan: EKG 12-Lead, POCT glucose (manual entry), Ambulatory referral to Cardiology, lisinopril-hydrochlorothiazide (PRINZIDE,ZESTORETIC) 20-12.5 MG tablet  SOB (shortness of breath) - Plan: EKG 12-Lead, POCT  glucose (manual entry), Ambulatory referral to Cardiology  Left groin pain - Plan: POCT CBC, POCT urinalysis dipstick, POCT Microscopic Urinalysis ([facility]), US Scrotum, [place] Art/Ven Flow Abd Pelv Doppler, CT Abdomen Pelvis W Contrast, CANCELED: US Pelvis Limited  Chest pain, unspecified chest pain type - Plan: EKG 12-Lead, POCT glucose (manual entry), Ambulatory referral to Cardiology, lisinopril-hydrochlorothiazide (PRINZIDE,ZESTORETIC) 20-12.5 MG tablet  Ivar Drape, PA-C [facility] 05/26/2016 11:02 AM

## 2016-05-29 ENCOUNTER — Telehealth: Payer: Self-pay

## 2016-05-29 NOTE — Telephone Encounter (Signed)
Left message for the patient to return my call.  The patient is scheduled for a ultrasound of the scrotum tomorrow, 05/30/16, at [facility] at 3:00pm.  Arrival time is 2:45pm.  If the patient needs to reschedule, he can contact the scheduling department at [phone].

## 2016-05-30 ENCOUNTER — Other Ambulatory Visit: Payer: Self-pay | Admitting: *Deleted

## 2016-05-30 ENCOUNTER — Telehealth: Payer: Self-pay | Admitting: *Deleted

## 2016-05-30 ENCOUNTER — Ambulatory Visit (HOSPITAL_COMMUNITY): Admission: RE | Admit: 2016-05-30 | Payer: BC Managed Care – PPO | Source: Ambulatory Visit

## 2016-05-30 DIAGNOSIS — R1032 Left lower quadrant pain: Secondary | ICD-10-CM

## 2016-05-30 DIAGNOSIS — Z9889 Other specified postprocedural states: Secondary | ICD-10-CM

## 2016-05-30 DIAGNOSIS — Z8719 Personal history of other diseases of the digestive system: Secondary | ICD-10-CM

## 2016-05-30 NOTE — Telephone Encounter (Signed)
Ultrasound canceled per [place].  Patient notified.

## 2016-05-30 NOTE — Telephone Encounter (Signed)
Patient notified that ultrasound was cancelled and he will be referred to general surgery.

## 2016-05-31 ENCOUNTER — Telehealth: Payer: Self-pay

## 2016-05-31 NOTE — Telephone Encounter (Signed)
An order was placed for a ultrasound of the scrotum for the patient; however, an additional order is needed in order to schedule an appointment.  Please add an order for [place] Art/Ven Flow Abd Pelv Doppler.  Thank you.

## 2016-05-31 NOTE — Telephone Encounter (Signed)
This test was cancelled. Joseph [name] did you mean this?

## 2016-06-01 NOTE — Telephone Encounter (Signed)
We will proceed to refer to general surgery.  sheketia said it was cancelled, so I assume this is what the patient wanted.  She contacted me via telephone while I was out of the office.  Nevertheless, we can refer him to general surgery following a hernia repair--for this chronicly painful area.  MRI was untelling for this left sided pain

## 2016-06-16 ENCOUNTER — Ambulatory Visit (INDEPENDENT_AMBULATORY_CARE_PROVIDER_SITE_OTHER): Payer: BC Managed Care – PPO | Admitting: Physician Assistant

## 2016-06-16 VITALS — BP 146/84 | HR 74 | Temp 98.1°F | Resp 18 | Ht 71.0 in | Wt 214.4 lb

## 2016-06-16 DIAGNOSIS — I10 Essential (primary) hypertension: Secondary | ICD-10-CM

## 2016-06-16 NOTE — Patient Instructions (Addendum)
[facility] is at [facility].  Please confirm your appointment.  You can call them at [phone] Call within the week I will have your lab results within the next 7-10 days.  The dash diet below is good to help watch the blood pressure. Please remember to check your bp twice per week, and report back to me and/or the cardiologist. DASH Eating Plan DASH stands for "Dietary Approaches to Stop Hypertension." The DASH eating plan is a healthy eating plan that has been shown to reduce high blood pressure (hypertension). Additional health benefits may include reducing the risk of type 2 diabetes mellitus, heart disease, and stroke. The DASH eating plan may also help with weight loss. WHAT DO I NEED TO KNOW ABOUT THE DASH EATING PLAN? For the DASH eating plan, you will follow these general guidelines:  Choose foods with a percent daily value for sodium of less than 5% (as listed on the food label).  Use salt-free seasonings or herbs instead of table salt or sea salt.  Check with your health care provider or pharmacist before using salt substitutes.  Eat lower-sodium products, often labeled as "lower sodium" or "no salt added."  Eat fresh foods.  Eat more vegetables, fruits, and low-fat dairy products.  Choose whole grains. Look for the word "whole" as the first word in the ingredient list.  Choose fish and skinless chicken or [place] more often than red meat. Limit fish, poultry, and meat to 6 oz (170 g) each day.  Limit sweets, desserts, sugars, and sugary drinks.  Choose heart-healthy fats.  Limit cheese to 1 oz (28 g) per day.  Eat more home-cooked food and less restaurant, buffet, and fast food.  Limit fried foods.  Cook foods using methods other than frying.  Limit canned vegetables. If you do use them, rinse them well to decrease the sodium.  When eating at a restaurant, ask that your food be prepared with less salt, or no salt if possible. WHAT FOODS CAN I  EAT? Seek help from a dietitian for individual calorie needs. Grains Whole grain or whole wheat bread. Brown rice. Whole grain or whole wheat pasta. Quinoa, bulgur, and whole grain cereals. Low-sodium cereals. Corn or whole wheat flour tortillas. Whole grain cornbread. Whole grain crackers. Low-sodium crackers. Vegetables Fresh or frozen vegetables (raw, steamed, roasted, or grilled). Low-sodium or reduced-sodium tomato and vegetable juices. Low-sodium or reduced-sodium tomato sauce and paste. Low-sodium or reduced-sodium canned vegetables.  Fruits All fresh, canned (in natural juice), or frozen fruits. Meat and Other Protein Products Ground beef (85% or leaner), grass-fed beef, or beef trimmed of fat. Skinless chicken or [place]. Ground chicken or [place]. Pork trimmed of fat. All fish and seafood. Eggs. Dried beans, peas, or lentils. Unsalted nuts and seeds. Unsalted canned beans. Dairy Low-fat dairy products, such as skim or 1% milk, 2% or reduced-fat cheeses, low-fat ricotta or cottage cheese, or plain low-fat yogurt. Low-sodium or reduced-sodium cheeses. Fats and Oils Tub margarines without trans fats. Light or reduced-fat mayonnaise and salad dressings (reduced sodium). Avocado. Safflower, olive, or canola oils. Natural peanut or almond butter. Other Unsalted popcorn and pretzels. The items listed above may not be a complete list of recommended foods or beverages. Contact your dietitian for more options. WHAT FOODS ARE NOT RECOMMENDED? Grains White bread. White pasta. White rice. Refined cornbread. Bagels and croissants. Crackers that contain trans fat. Vegetables Creamed or fried vegetables. Vegetables in a cheese sauce. Regular canned vegetables. Regular canned tomato sauce and paste.  Regular tomato and vegetable juices. Fruits Dried fruits. Canned fruit in light or heavy syrup. Fruit juice. Meat and Other Protein Products Fatty cuts of meat. Ribs, chicken wings, bacon, sausage,  bologna, salami, chitterlings, fatback, hot dogs, bratwurst, and packaged luncheon meats. Salted nuts and seeds. Canned beans with salt. Dairy Whole or 2% milk, cream, half-and-half, and cream cheese. Whole-fat or sweetened yogurt. Full-fat cheeses or blue cheese. Nondairy creamers and whipped toppings. Processed cheese, cheese spreads, or cheese curds. Condiments Onion and garlic salt, seasoned salt, table salt, and sea salt. Canned and packaged gravies. Worcestershire sauce. Tartar sauce. Barbecue sauce. Teriyaki sauce. Soy sauce, including reduced sodium. Steak sauce. Fish sauce. Oyster sauce. Cocktail sauce. Horseradish. Ketchup and mustard. Meat flavorings and tenderizers. Bouillon cubes. Hot sauce. Tabasco sauce. Marinades. Taco seasonings. Relishes. Fats and Oils Butter, stick margarine, lard, shortening, ghee, and bacon fat. Coconut, palm kernel, or palm oils. Regular salad dressings. Other Pickles and olives. Salted popcorn and pretzels. The items listed above may not be a complete list of foods and beverages to avoid. Contact your dietitian for more information. WHERE CAN I FIND MORE INFORMATION? National Heart, Lung, and Blood Institute: travelstabloid.com   This information is not intended to replace advice given to you by your health care provider. Make sure you discuss any questions you have with your health care provider.   Document Released: 09/14/2011 Document Revised: 10/16/2014 Document Reviewed: 07/30/2013 Elsevier Interactive Patient Education 2016 [employer].      IF you received an x-ray today, you will receive an invoice from [facility]. Please contact [facility] at [phone] with questions or concerns regarding your invoice.   IF you received labwork today, you will receive an invoice from [employer]. Please contact [facility] at [phone] with questions or concerns regarding your  invoice.   Our billing staff will not be able to assist you with questions regarding bills from these companies.  You will be contacted with the lab results as soon as they are available. The fastest way to get your results is to activate your My Chart account. Instructions are located on the last page of this paperwork. If you have not heard from [place] regarding the results in 2 weeks, please contact this office.

## 2016-06-16 NOTE — Progress Notes (Signed)
Patient ID: Joseph [name], male   DOB: 1960/12/18, 55 y.o.   MRN: [id number] [facility] [address], [place] [address] [phone]  By signing my name below, I, Essence Howell, attest that this documentation has been prepared under the direction and in the presence of Ivar Drape, PA-C Electronically Signed: Ladene Artist, ED Scribe 06/16/2016 at 5:43 PM.  Date:  06/16/2016   Name:  Joseph [name]   DOB:  July 07, 1961   MRN:  [id number]  PCP:  No PCP Per Patient   History of Present Illness:  Joseph [name] is a 55 y.o. male patient who presents to [facility] for a follow-up regarding HTN. Pt's triage BP: 180/90, repeat BP: 146/84. Pt states that he has been compliant with the medications but has not checked his BP at home. He has not changed his diet since his last visit. States he does not eat too much fried or canned foods but drinks 1-2 [employer] daily. He states that his HAs have resolved. He states that chest pain has improved but he still has intermittent deep, cramping chest pain. Pt reports 1 episode that occurred today while walking that lasted for approximately 20 minutes. He denies nausea, diaphoresis, dizziness during the episode. Pt believes that he has an upcoming appointment with [facility] on 9/13. He has an upcoming appointment with Dr. Donne Hazel on 9/29 to follow-up on his hernia repair. Pt has been managing his pain with ibuprofen.   Patient Active Problem List   Diagnosis Date Noted   Inguinal hernia unilateral, non-recurrent 08/08/2011    Past Medical History:  Diagnosis Date   Asthma    child   Hypertension    Inguinal hernia    LIH   Pilonidal cyst    Rectal bleeding    Tuberculosis    tb  dx 3-4    Past Surgical History:  Procedure Laterality Date   INGUINAL HERNIA REPAIR  09/07/2011   Procedure: HERNIA REPAIR INGUINAL ADULT;  Surgeon: Rolm Bookbinder, MD;  Location: [facility] OR;  Service:  General;  Laterality: Left;   PILONIDAL CYST EXCISION      Social History  Substance Use Topics   Smoking status: Current Every Day Smoker    Packs/day: 0.50   Smokeless tobacco: Not on file   Alcohol use Yes    No family history on file.  No Known Allergies  Medication list has been reviewed and updated.  Current Outpatient Prescriptions on File Prior to Visit  Medication Sig Dispense Refill   lisinopril-hydrochlorothiazide (PRINZIDE,ZESTORETIC) 20-12.5 MG tablet Take 1 tablet by mouth daily. 30 tablet 1   No current facility-administered medications on file prior to visit.     Review of Systems  Constitutional: Negative for diaphoresis.  Cardiovascular: Positive for chest pain (intermittent).  Gastrointestinal: Negative for nausea.  Neurological: Negative for dizziness.    Physical Examination: BP (!) 146/84    Pulse 74    Temp 98.1 F (36.7 C)    Resp 18    Ht 5\' 11"  (1.803 m)    Wt 214 lb 6.4 oz (97.3 kg)    SpO2 99%    BMI 29.90 kg/m  Ideal Body Weight: @FLOWAMB [id number]  Physical Exam  Constitutional: He is oriented to person, place, and time. He appears well-developed and well-nourished. No distress.  HENT:  Head: Normocephalic and atraumatic.  Eyes: Conjunctivae and EOM are normal. Pupils are equal, round, and reactive to light.  Cardiovascular: Normal rate, regular rhythm  and normal pulses.  Exam reveals no gallop and no friction rub.   No murmur heard. Pulmonary/Chest: Effort normal and breath sounds normal. No respiratory distress.  Neurological: He is alert and oriented to person, place, and time.  Skin: Skin is warm and dry. He is not diaphoretic.  Psychiatric: He has a normal mood and affect. His behavior is normal.    Assessment and Plan: Joseph [name] is a 55 y.o. male who is here today for Recheck of blood pressure. This appears to be improving. We are gathering the following labs. Advised to follow-up with [facility] vascular for  appointment. He was also given information about the-diet to follow. Also advised him to check his blood pressure twice per week.  Essential hypertension - Plan: COMPLETE METABOLIC PANEL WITH GFR, TSH, Lipid panel  Ivar Drape, PA-C [facility] 06/16/2016 5:43 PM

## 2016-06-17 LAB — COMPLETE METABOLIC PANEL WITH GFR
ALT: 18 U/L (ref 9–46)
AST: 18 U/L (ref 10–35)
Albumin: 4.6 g/dL (ref 3.6–5.1)
Alkaline Phosphatase: 77 U/L (ref 40–115)
BUN: 10 mg/dL (ref 7–25)
CHLORIDE: 105 mmol/L (ref 98–110)
CO2: 30 mmol/L (ref 20–31)
Calcium: 10.3 mg/dL (ref 8.6–10.3)
Creat: 1.12 mg/dL (ref 0.70–1.33)
GFR, Est African American: 86 mL/min (ref >=60)
GFR, Est Non African American: 74 mL/min (ref >=60)
GLUCOSE: 94 mg/dL (ref 65–99)
POTASSIUM: 4.3 mmol/L (ref 3.5–5.3)
SODIUM: 140 mmol/L (ref 135–146)
Total Bilirubin: 0.5 mg/dL (ref 0.2–1.2)
Total Protein: 8 g/dL (ref 6.1–8.1)

## 2016-06-17 LAB — TSH: TSH: 0.65 mIU/L (ref 0.40–4.50)

## 2016-06-17 LAB — LIPID PANEL
CHOL/HDL RATIO: 4.7 ratio (ref <=5.0)
Cholesterol: 184 mg/dL (ref 125–200)
HDL: 39 mg/dL — AB (ref >=40)
LDL CALC: 113 mg/dL (ref <130)
Triglycerides: 162 mg/dL — ABNORMAL HIGH (ref <150)
VLDL: 32 mg/dL — AB (ref <30)

## 2016-06-27 ENCOUNTER — Encounter: Payer: Self-pay | Admitting: Physician Assistant

## 2016-07-01 ENCOUNTER — Encounter: Payer: Self-pay | Admitting: Physician Assistant

## 2016-07-01 DIAGNOSIS — I700 Atherosclerosis of aorta: Secondary | ICD-10-CM | POA: Insufficient documentation

## 2016-07-01 DIAGNOSIS — I10 Essential (primary) hypertension: Secondary | ICD-10-CM | POA: Insufficient documentation

## 2016-07-01 DIAGNOSIS — R9431 Abnormal electrocardiogram [ECG] [EKG]: Secondary | ICD-10-CM | POA: Insufficient documentation

## 2016-07-01 DIAGNOSIS — F17200 Nicotine dependence, unspecified, uncomplicated: Secondary | ICD-10-CM | POA: Insufficient documentation

## 2016-07-01 DIAGNOSIS — Q6589 Other specified congenital deformities of hip: Secondary | ICD-10-CM | POA: Insufficient documentation

## 2016-07-01 DIAGNOSIS — E785 Hyperlipidemia, unspecified: Secondary | ICD-10-CM | POA: Insufficient documentation

## 2016-07-01 DIAGNOSIS — I739 Peripheral vascular disease, unspecified: Secondary | ICD-10-CM | POA: Insufficient documentation

## 2016-07-01 DIAGNOSIS — R0789 Other chest pain: Secondary | ICD-10-CM | POA: Insufficient documentation

## 2016-07-17 ENCOUNTER — Other Ambulatory Visit: Payer: Self-pay | Admitting: Physician Assistant

## 2016-07-17 DIAGNOSIS — R079 Chest pain, unspecified: Secondary | ICD-10-CM

## 2016-07-17 DIAGNOSIS — I10 Essential (primary) hypertension: Secondary | ICD-10-CM

## 2016-07-18 NOTE — Telephone Encounter (Signed)
06/2016 last ov and labs 

## 2016-07-23 NOTE — H&P (Signed)
OFFICE VISIT NOTES COPIED TO EPIC FOR DOCUMENTATION  . History of Present Illness Neldon Labella AGNP-C; 07/20/2016 9:27 AM) Patient words: Last O/V 06/21/2016 F/U for nuc, echo, le duplex.  The patient is a 55 year old male who presents for a Follow-up for Chest pain. He was recently seen by his PCP for a routine physical exam. Blood pressure was noted to be markedly elevated at 214/120. He was started on Lisinopril/HCTZ 20/12.5 mg. When questioned specifically, he admitted to intermittent episodes of sharp chest pain. He states this has been occurring for years without any exertional component and has never been that concerning to him. He denies any shortness of breath, exertional or otherwise. No history of diabetes or thyroid disease. He had a CT scan of his abdomen on 05/26/2016 for abdominal pain which revealed minimal atherosclerotic changes of the aorta and iliac arteries. He currently smokes one half a pack of cigarettes per day. Otherwise, denies any PND, orthopnea, edema, dizziness, syncope, or symptoms suggestive of claudication or TIA. No headaches or visual disturbances.   Problem List/Past Medical Anderson Malta Sergeant; 07/20/2016 9:12 AM) Tobacco use disorder (F17.200)  Aortic atherosclerosis (I70.0)  CT abdomen 05/26/2016: Minimal atherosclerotic calcification of the aorta and iliac arteries. Accelerated hypertension (I10)  Atypical chest pain (R07.89)  Exercise sestamibi stress test 07/03/2016: 1. The resting electrocardiogram demonstrated normal sinus rhythm, normal resting conduction and no resting arrhythmias. Borderline criteria for LVH. The stress electrocardiogram was normal. Patient exercised on Bruce protocol for 8.07 minutes and achieved 10.16 METS. Stress test terminated due to 88 % MPHR achieved (Target HR >85%). Symptoms included dyspnea. Normal exercise tolerence. 2. The left ventricle was mildly dilated both during rest images, LV end-diastolic volume Q000111Q  mL.Marland Kitchen Perfusion imaging study demonstrated uniform uptake of radioisotope without evidence of ischemia or scar. Left ventricular systolic function calculated by QGS was moderate to severely depressed at 38% however visually appeared to be at least low-normal. This is an intermediate risk study, clinical correlation recommended. Labwork  06/16/2016: Total cholesterol 184, triglycerides 162, HDL 39, LDL 113, TSH 0.65, creatinine 1.12, potassium 4.3 Mild hyperlipidemia (E78.5)  Abnormal EKG (R94.31)  Echocardiogram 07/12/2016: Poor apical window. Wall motion reduced sensitivity. Left ventricle cavity is normal in size. Moderate concentric hypertrophy of the left ventricle. Normal global wall motion. Doppler evidence of grade I (impaired) diastolic dysfunction. Calculated EF 55%. Mild (Grade I) mitral regurgitation. Trace tricuspid regurgitation. Unable to estimate PA pressure due to absence/minimal TR signal. 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 Significant velocity increase at the left distal superficial femoral artery suggests >50% stenosis. Proximal SFA shows monophasic waveforms suggestive of severe diffuse disease.  This exam reveals moderately decreased perfusion of the lower extremity with RABI 0.78 and LABI 0.56, noted at the post tibial artery level. Consider further w/u. Hypertension, essential, benign (I10)   Allergies Anderson Malta Sergeant; 07/20/2016 9:12 AM) No Known Drug Allergies 06/21/2016  Family History Anderson Malta Sergeant; 07/20/2016 9:12 AM) Mother  Living, no known heart conditions, htn Father  Deceased. unknown age at passing, no known history Sister 2  older, no known heart conditions Brother 2  younger, no known heart  conditions  Social History Anderson Malta Sergeant; 07/20/2016 9:12 AM) Current tobacco use  Current every day smoker. 1/2 ppd Alcohol Use  Occasional alcohol use. Marital  status  Married. Living Situation  Lives with spouse. Number of Children  2.  Past Surgical History Anderson Malta Sergeant; 07/20/2016 9:12 AM) Inguinal Hernia Repair-Bilateral 09/07/2011  Medication History Anderson Malta Sergeant; 07/20/2016 9:17 AM) AmLODIPine Besylate (10MG  Tablet, 1 (one) Tablet Oral daily, Taken starting 06/21/2016) Active. Rosuvastatin Calcium (10MG  Tablet, 1 (one) Tablet Oral daily, Taken starting 06/21/2016) Active. Aspirin Childrens (81MG  Tablet Chewable, 1 (one) Tablet Oral daily, Taken starting 06/21/2016) Active. Lisinopril-Hydrochlorothiazide (20-12.5MG  Tablet, 1 Oral daily) Active. Medications Reconciled (verbally)  Diagnostic Studies History Anderson Malta Sergeant; 07/20/2016 9:12 AM) Lower Extremity Dopplers 07/12/2016 No hemodynamically significant stenoses are identified in the right lower extremity arterial system. Significant velocity increase at the left distal superficial femoral artery suggests >50% stenosis. Proximal SFA shows monophasic waveforms suggestive of severe diffuse disease. This exam reveals moderately decreased perfusion of the lower extremity with RABI 0.78 and LABI 0.56, noted at the post tibial artery level. Consider further w/u. Nuclear stress test 07/03/2016 1. The resting electrocardiogram demonstrated normal sinus rhythm, normal resting conduction and no resting arrhythmias. Borderline criteria for LVH. The stress electrocardiogram was normal. Patient exercised on Bruce protocol for 8.07 minutes and achieved 10.16 METS. Stress test terminated due to 88 % MPHR achieved (Target HR >85%). Symptoms included dyspnea. Normal exercise tolerence. 2. The left ventricle was mildly dilated both during rest images, LV end-diastolic volume Q000111Q mL.Marland Kitchen Perfusion imaging study demonstrated  uniform uptake of radioisotope without evidence of ischemia or scar. Left ventricular systolic function calculated by QGS was moderate to severely depressed at 38% however visually appeared to be at least low-normal. This is an intermediate risk study, clinical correlation recommended. Echocardiogram 07/12/2016 Poor apical window. Wall motion reduced sensitivity. Left ventricle cavity is normal in size. Moderate concentric hypertrophy of the left ventricle. Normal global wall motion. Doppler evidence of grade I (impaired) diastolic dysfunction. Calculated EF 55%. Mild (Grade I) mitral regurgitation. Trace tricuspid regurgitation. Unable to estimate PA pressure due to absence/minimal TR signal. CT Scan of Abdomen 05/26/2016 Minimal atherosclerotic calcification of the aorta and iliac arteries.    Review of Systems (Bridgette Ebony Hail AGNP-C; 07/20/2016 9:48 AM) General Not Present- Anorexia, Fatigue and Fever. Respiratory Not Present- Cough, Decreased Exercise Tolerance, Difficulty Breathing on Exertion and Dyspnea. Cardiovascular Present- Claudications. Not Present- Chest Pain, Edema, Orthopnea, Palpitations and Paroxysmal Nocturnal Dyspnea. Gastrointestinal Not Present- Black, Tarry Stool, Change in Bowel Habits and Nausea. Neurological Not Present- Focal Neurological Symptoms and Syncope. Endocrine Not Present- Cold Intolerance, Excessive Sweating, Heat Intolerance and Thyroid Problems. Hematology Not Present- Anemia, Easy Bruising, Petechiae and Prolonged Bleeding.  Vitals Anderson Malta Sergeant; 07/20/2016 9:20 AM) 07/20/2016 9:13 AM Weight: 216 lb Height: 71in Body Surface Area: 2.18 m Body Mass Index: 30.13 kg/m  Pulse: 64 (Regular)  P.OX: 95% (Room air) BP: 118/82 (Sitting, Left Arm, Standard)       Physical Exam (Bridgette Ebony Hail, AGNP-C; 07/20/2016 9:48 AM) General Mental Status-Alert. General Appearance-Cooperative, Appears stated age, Not in acute  distress. Build & Nutrition-Moderately built.  Head and Neck Thyroid Gland Characteristics - no palpable nodules, no palpable enlargement.  Chest and Lung Exam Palpation Tender - No chest wall tenderness. Auscultation Breath sounds - Clear.  Cardiovascular Inspection Jugular vein - Right - No Distention. Auscultation Heart Sounds - S1 WNL, S2 WNL and No gallop present. Murmurs & Other Heart Sounds - Murmur - No murmur.  Abdomen Palpation/Percussion Normal exam - Non Tender and No hepatosplenomegaly. Auscultation Normal exam - Bowel sounds normal.  Peripheral Vascular Lower Extremity Inspection - Bilateral - Inspection  Normal. Palpation - Edema - Bilateral - No edema. Femoral pulse - Bilateral - Normal. Popliteal pulse - Left - Absent. Right - 1+. Dorsalis pedis pulse - Left - Absent. Right - 1+. Posterior tibial pulse - Left - Absent. Right - 1+. Bilateral - Normal. Carotid arteries - Bilateral-No Carotid bruit. Abdomen-No prominent abdominal aortic pulsation, No epigastric bruit.  Neurologic Neurologic evaluation reveals -alert and oriented x 3 with no impairment of recent or remote memory. Motor-Grossly intact without any focal deficits.  Musculoskeletal Global Assessment Left Lower Extremity - normal range of motion without pain. Right Lower Extremity - normal range of motion without pain.    Assessment & Plan (Bridgette Ebony Hail AGNP-C; 07/20/2016 9:48 AM) Atypical chest pain (R07.89) Story: Exercise sestamibi stress test 07/03/2016: 1. The resting electrocardiogram demonstrated normal sinus rhythm, normal resting conduction and no resting arrhythmias. Borderline criteria for LVH. The stress electrocardiogram was normal. Patient exercised on Bruce protocol for 8.07 minutes and achieved 10.16 METS. Stress test terminated due to 88 % MPHR achieved (Target HR >85%). Symptoms included dyspnea. Normal exercise tolerence. 2. The left ventricle was mildly dilated  both during rest images, LV end-diastolic volume Q000111Q mL.Marland Kitchen Perfusion imaging study demonstrated uniform uptake of radioisotope without evidence of ischemia or scar. Left ventricular systolic function calculated by QGS was moderate to severely depressed at 38% however visually appeared to be at least low-normal. This is an intermediate risk study, clinical correlation recommended. Impression: EKG 06/21/2016: Normal sinus rhythm at rate of 64 bpm, normal axis. Nonspecific inferior and lateral ST segment changes, normal QT interval. Accelerated hypertension (I10) Current Plans Started Lisinopril-Hydrochlorothiazide 20-12.5MG , 1 Tablet daily, #30, 07/20/2016, Ref. x3. 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 Significant velocity increase at the left distal superficial femoral artery suggests >50% stenosis. Proximal SFA shows monophasic waveforms suggestive of severe diffuse disease.  This exam reveals moderately decreased perfusion of the lower extremity with RABI 0.78 and LABI 0.56, noted at the post tibial artery level. Consider further w/u. Abnormal EKG (R94.31) Story: Echocardiogram 07/12/2016: Poor apical window. Wall motion reduced sensitivity. Left ventricle cavity is normal in size. Moderate concentric hypertrophy of the left ventricle. Normal global wall motion. Doppler evidence of grade I (impaired) diastolic dysfunction. Calculated EF 55%. Mild (Grade I) mitral regurgitation. Trace tricuspid regurgitation. Unable to estimate PA pressure due to absence/minimal TR signal. Mild hyperlipidemia (E78.5) Current Plans Changed Rosuvastatin Calcium 20MG , 1 (one) Tablet daily, #30, 07/20/2016, Ref. x2. 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   Current Plans Mechanism of underlying disease process and action of medications discussed with the patient. I discussed primary/secondary prevention and also dietary counseling was done. He presents for follow-up of echocardiogram, nuclear stress test, and lower extremity arterial duplex. Echocardiogram revealed moderate LVH with preserved LVEF at 55%. Stress test revealed normal perfusion imaging, however with moderate to severely depressed LVEF at 38%, however visually just low normal. Stress EKG was normal at 10.16 METs. He denies any recurrence of chest pain or shortness of breath, even with heavy exertion. Lower extremity duplex revealed evidence  of greater than 50% stenosis of the left distal superficial femoral artery with markedly reduced ABI of 0.56 and right ABI of 0.78. Patient continues to complain of left hip pain and reports that his left leg often "gives out". Discussed further evaluation and possible percutaneous intervention, however patient is very hesitant to proceeding with this and would like to discuss this with his wife 1st. Procedure was discussed at length as well as risks including, but not limited to, <1-2% risk of death, embolic complications, bleeding, infection, renal failure, and urgent surgical revascularization. No evidence of limb threatening ischemia. Given significant atherosclerosis, I have increased Crestor to 20 mg daily. Blood pressure is now very well controlled. He has remained abstinent from tobacco use since his last office visit. I have tentatively scheduled follow-up for 2 months, however he will call the office if he wishes to proceed with PV angiogram. Addendum  Note(Bridgette Allison AGNP-C; 07/21/2016 2:03 PM) 07/21/2016: Creatinine 1.1, potassium 3.8, Hb 12.0/HCT 35.4 with normocytic indices, PT/INR normal  Labs stable to proceed with PV angiogram. Signed by Neldon Labella, AGNP-C (07/20/2016 9:48 AM)

## 2016-07-25 ENCOUNTER — Ambulatory Visit (HOSPITAL_COMMUNITY)
Admission: RE | Admit: 2016-07-25 | Discharge: 2016-07-25 | Disposition: A | Discharge status: Home / Self Care | Payer: BC Managed Care – PPO | Source: Ambulatory Visit | Attending: Cardiology | Admitting: Cardiology

## 2016-07-25 ENCOUNTER — Encounter (HOSPITAL_COMMUNITY)
Admission: RE | Disposition: A | Discharge status: Home / Self Care | Payer: Self-pay | Source: Ambulatory Visit | Attending: Cardiology

## 2016-07-25 DIAGNOSIS — I70212 Atherosclerosis of native arteries of extremities with intermittent claudication, left leg: Secondary | ICD-10-CM | POA: Insufficient documentation

## 2016-07-25 DIAGNOSIS — R0789 Other chest pain: Secondary | ICD-10-CM | POA: Diagnosis not present

## 2016-07-25 DIAGNOSIS — E785 Hyperlipidemia, unspecified: Secondary | ICD-10-CM | POA: Insufficient documentation

## 2016-07-25 DIAGNOSIS — I10 Essential (primary) hypertension: Secondary | ICD-10-CM | POA: Insufficient documentation

## 2016-07-25 DIAGNOSIS — F17210 Nicotine dependence, cigarettes, uncomplicated: Secondary | ICD-10-CM | POA: Diagnosis not present

## 2016-07-25 DIAGNOSIS — Z7982 Long term (current) use of aspirin: Secondary | ICD-10-CM | POA: Insufficient documentation

## 2016-07-25 DIAGNOSIS — I340 Nonrheumatic mitral (valve) insufficiency: Secondary | ICD-10-CM | POA: Diagnosis not present

## 2016-07-25 DIAGNOSIS — I739 Peripheral vascular disease, unspecified: Secondary | ICD-10-CM | POA: Diagnosis present

## 2016-07-25 DIAGNOSIS — Q6589 Other specified congenital deformities of hip: Secondary | ICD-10-CM | POA: Diagnosis not present

## 2016-07-25 DIAGNOSIS — I700 Atherosclerosis of aorta: Secondary | ICD-10-CM | POA: Insufficient documentation

## 2016-07-25 DIAGNOSIS — Z8249 Family history of ischemic heart disease and other diseases of the circulatory system: Secondary | ICD-10-CM | POA: Diagnosis not present

## 2016-07-25 HISTORY — PX: PERIPHERAL VASCULAR CATHETERIZATION: SHX172C

## 2016-07-25 LAB — POCT ACTIVATED CLOTTING TIME
Activated Clotting Time: 246 seconds
Activated Clotting Time: 252 seconds

## 2016-07-25 SURGERY — LOWER EXTREMITY ANGIOGRAPHY

## 2016-07-25 MED ORDER — LIDOCAINE HCL (PF) 1 % IJ SOLN
INTRAMUSCULAR | Status: DC | PRN
  Administered 2016-07-25: 20 mL

## 2016-07-25 MED ORDER — IODIXANOL 320 MG/ML IV SOLN
INTRAVENOUS | Status: DC | PRN
  Administered 2016-07-25: 165 mL via INTRA_ARTERIAL

## 2016-07-25 MED ORDER — CLOPIDOGREL BISULFATE 300 MG PO TABS
ORAL_TABLET | ORAL | Status: AC
  Filled 2016-07-25: qty 2

## 2016-07-25 MED ORDER — HEPARIN SODIUM (PORCINE) 1000 UNIT/ML IJ SOLN
INTRAMUSCULAR | Status: DC | PRN
  Administered 2016-07-25: 3000 [IU] via INTRAVENOUS
  Administered 2016-07-25: 9000 [IU] via INTRAVENOUS
  Administered 2016-07-25: 2000 [IU] via INTRAVENOUS

## 2016-07-25 MED ORDER — CLOPIDOGREL BISULFATE 75 MG PO TABS: 75.0000 mg | ORAL_TABLET | Freq: Every day | ORAL | 1 refills | Status: DC

## 2016-07-25 MED ORDER — HYDROMORPHONE HCL 1 MG/ML IJ SOLN
INTRAMUSCULAR | Status: AC
  Filled 2016-07-25: qty 1

## 2016-07-25 MED ORDER — CLOPIDOGREL BISULFATE 300 MG PO TABS
600.0000 mg | ORAL_TABLET | Freq: Every day | ORAL | Status: DC
Start: 2016-07-25 — End: 2016-07-25

## 2016-07-25 MED ORDER — HEPARIN SODIUM (PORCINE) 1000 UNIT/ML IJ SOLN
INTRAMUSCULAR | Status: AC
  Filled 2016-07-25: qty 1

## 2016-07-25 MED ORDER — HEPARIN (PORCINE) IN NACL 2-0.9 UNIT/ML-% IJ SOLN
INTRAMUSCULAR | Status: AC
  Filled 2016-07-25: qty 1000

## 2016-07-25 MED ORDER — HEPARIN (PORCINE) IN NACL 2-0.9 UNIT/ML-% IJ SOLN: INTRAMUSCULAR | Status: DC | PRN

## 2016-07-25 MED ORDER — HEPARIN (PORCINE) IN NACL 2-0.9 UNIT/ML-% IJ SOLN
INTRAMUSCULAR | Status: DC | PRN
  Administered 2016-07-25: 1000 mL

## 2016-07-25 MED ORDER — SODIUM CHLORIDE 0.9 % IV SOLN: 250.0000 mL | INTRAVENOUS | Status: DC | PRN

## 2016-07-25 MED ORDER — MIDAZOLAM HCL 2 MG/2ML IJ SOLN
INTRAMUSCULAR | Status: DC | PRN
  Administered 2016-07-25: 2 mg via INTRAVENOUS

## 2016-07-25 MED ORDER — SODIUM CHLORIDE 0.9 % IV SOLN: 1.0000 mL/kg/h | INTRAVENOUS | Status: DC

## 2016-07-25 MED ORDER — SODIUM CHLORIDE 0.9% FLUSH: 3.0000 mL | INTRAVENOUS | Status: DC | PRN

## 2016-07-25 MED ORDER — CLOPIDOGREL BISULFATE 300 MG PO TABS
ORAL_TABLET | ORAL | Status: DC | PRN
  Administered 2016-07-25: 600 mg via ORAL

## 2016-07-25 MED ORDER — HYDROMORPHONE HCL 1 MG/ML IJ SOLN
INTRAMUSCULAR | Status: DC | PRN
  Administered 2016-07-25 (×2): 0.5 mg via INTRAVENOUS

## 2016-07-25 MED ORDER — MIDAZOLAM HCL 2 MG/2ML IJ SOLN
INTRAMUSCULAR | Status: AC
  Filled 2016-07-25: qty 2

## 2016-07-25 MED ORDER — SODIUM CHLORIDE 0.9% FLUSH
3.0000 mL | Freq: Two times a day (BID) | INTRAVENOUS | Status: DC
Start: 2016-07-25 — End: 2016-07-25

## 2016-07-25 MED ORDER — LIDOCAINE HCL (PF) 1 % IJ SOLN
INTRAMUSCULAR | Status: AC
  Filled 2016-07-25: qty 30

## 2016-07-25 MED ORDER — SODIUM CHLORIDE 0.9 % IV SOLN
INTRAVENOUS | Status: DC
  Administered 2016-07-25: 10:00:00 via INTRAVENOUS

## 2016-07-25 SURGICAL SUPPLY — 22 items
BALLN IN.PACT DCB 6X150 (BALLOONS) ×3
BALLOON ARMADA 2.5X60X150 (BALLOONS) ×3 IMPLANT
CATH CXI 4F 150 ST (CATHETERS) ×2 IMPLANT
CATH HAWKONE LX EXTENDED TIP (CATHETERS) ×3 IMPLANT
CATH OMNI FLUSH 5F 65CM (CATHETERS) ×2 IMPLANT
DCB IN.PACT 6X150 (BALLOONS) IMPLANT
DEVICE CLOSURE PERCLS PRGLD 6F (VASCULAR PRODUCTS) IMPLANT
DEVICE EMBOSHIELD NAV6 4.0-7.0 (WIRE) ×2 IMPLANT
KIT ENCORE 26 ADVANTAGE (KITS) ×2 IMPLANT
KIT MICROINTRODUCER STIFF 5F (SHEATH) ×2 IMPLANT
KIT PV (KITS) ×3 IMPLANT
PERCLOSE PROGLIDE 6F (VASCULAR PRODUCTS) ×3
SHEATH HIGHFLEX ANSEL 7FR 55CM (SHEATH) ×2 IMPLANT
SHEATH PINNACLE 5F 10CM (SHEATH) ×2 IMPLANT
SYRINGE MEDRAD AVANTA MACH 7 (SYRINGE) ×2 IMPLANT
TAPE RADIOPAQUE TURBO (MISCELLANEOUS) ×2 IMPLANT
TRANSDUCER W/STOPCOCK (MISCELLANEOUS) ×3 IMPLANT
TRAY PV CATH (CUSTOM PROCEDURE TRAY) ×3 IMPLANT
TUBING CIL FLEX 10 FLL-RA (TUBING) ×2 IMPLANT
WIRE BAREWIRE WORK .014X315CM (WIRE) ×2 IMPLANT
WIRE HI TORQ COMMND ES.014X300 (WIRE) ×2 IMPLANT
WIRE HITORQ VERSACORE ST 145CM (WIRE) ×3 IMPLANT

## 2016-07-25 NOTE — Discharge Instructions (Signed)
Groin Site Care °Refer to this sheet in the next few weeks. These instructions provide you with information about caring for yourself after your procedure. Your health care provider may also give you more specific instructions. Your treatment has been planned according to current medical practices, but problems sometimes occur. Call your health care provider if you have any problems or questions after your procedure. °WHAT TO EXPECT AFTER THE PROCEDURE °After your procedure, it is typical to have the following: °· Bruising at the groin site that usually fades within 1-2 weeks. °· Blood collecting in the tissue (hematoma) that may be painful to the touch. It should usually decrease in size and tenderness within 1-2 weeks. °HOME CARE INSTRUCTIONS °· Take medicines only as directed by your health care provider. °· You may shower 24-48 hours after the procedure or as directed by your health care provider. Remove the bandage (dressing) and gently wash the site with plain soap and water. Pat the area dry with a clean towel. Do not rub the site, because this may cause bleeding. °· Do not take baths, swim, or use a hot tub until your health care provider approves. °· Check your insertion site every day for redness, swelling, or drainage. °· Do not apply powder or lotion to the site. °· Limit use of stairs to twice a day for the first 2-3 days or as directed by your health care provider. °· Do not squat for the first 2-3 days or as directed by your health care provider. °· Do not lift over 10 lb (4.5 kg) for 5 days after your procedure or as directed by your health care provider. °· Ask your health care provider when it is okay to: °¨ Return to work or school. °¨ Resume usual physical activities or sports. °¨ Resume sexual activity. °· Do not drive home if you are discharged the same day as the procedure. Have someone else drive you. °· You may drive 24 hours after the procedure unless otherwise instructed by your health  care provider. °· Do not operate machinery or power tools for 24 hours after the procedure or as directed by your health care provider. °· If your procedure was done as an outpatient procedure, which means that you went home the same day as your procedure, a responsible adult should be with you for the first 24 hours after you arrive home. °· Keep all follow-up visits as directed by your health care provider. This is important. °SEEK MEDICAL CARE IF: °· You have a fever. °· You have chills. °· You have increased bleeding from the groin site. Hold pressure on the site. °SEEK IMMEDIATE MEDICAL CARE IF: °· You have unusual pain at the groin site. °· You have redness, warmth, or swelling at the groin site. °· You have drainage (other than a small amount of blood on the dressing) from the groin site. °· The groin site is bleeding, and the bleeding does not stop after 30 minutes of holding steady pressure on the site. °· Your leg or foot becomes pale, cool, tingly, or numb. °  °This information is not intended to replace advice given to you by your health care provider. Make sure you discuss any questions you have with your health care provider. °  °Document Released: 05/29/2014 Document Reviewed: 05/29/2014 °Elsevier Interactive Patient Education ©2016 [employer] ° °

## 2016-07-25 NOTE — Interval H&P Note (Signed)
History and Physical Interval Note:  07/25/2016 12:45 PM  Joseph [name]  has presented today for surgery, with the diagnosis of claudication  The various methods of treatment have been discussed with the patient and family. After consideration of risks, benefits and other options for treatment, the patient has consented to  Procedure(s): Lower Extremity Angiography (N/A) and possible PTA  as a surgical intervention .  The patient's history has been reviewed, patient examined, no change in status, stable for surgery.  I have reviewed the patient's chart and labs.  Questions were answered to the patient's satisfaction.     Adrian Prows

## 2016-07-26 ENCOUNTER — Encounter (HOSPITAL_COMMUNITY): Payer: Self-pay | Admitting: Cardiology

## 2016-08-14 ENCOUNTER — Encounter: Payer: Self-pay | Admitting: *Deleted

## 2016-08-20 ENCOUNTER — Other Ambulatory Visit: Payer: Self-pay | Admitting: Physician Assistant

## 2016-08-20 DIAGNOSIS — I10 Essential (primary) hypertension: Secondary | ICD-10-CM

## 2016-08-20 DIAGNOSIS — R079 Chest pain, unspecified: Secondary | ICD-10-CM

## 2017-01-22 ENCOUNTER — Encounter (HOSPITAL_COMMUNITY): Payer: Self-pay | Admitting: Emergency Medicine

## 2017-01-22 ENCOUNTER — Inpatient Hospital Stay (HOSPITAL_COMMUNITY)
Admission: EM | Admit: 2017-01-22 | Discharge: 2017-01-29 | DRG: 394 | Disposition: A | Discharge status: Home / Self Care | Payer: BC Managed Care – PPO | Attending: Family Medicine | Admitting: Family Medicine

## 2017-01-22 DIAGNOSIS — I739 Peripheral vascular disease, unspecified: Secondary | ICD-10-CM | POA: Diagnosis present

## 2017-01-22 DIAGNOSIS — D126 Benign neoplasm of colon, unspecified: Secondary | ICD-10-CM | POA: Diagnosis not present

## 2017-01-22 DIAGNOSIS — D120 Benign neoplasm of cecum: Secondary | ICD-10-CM | POA: Diagnosis present

## 2017-01-22 DIAGNOSIS — Z86718 Personal history of other venous thrombosis and embolism: Secondary | ICD-10-CM | POA: Diagnosis not present

## 2017-01-22 DIAGNOSIS — K922 Gastrointestinal hemorrhage, unspecified: Secondary | ICD-10-CM | POA: Diagnosis not present

## 2017-01-22 DIAGNOSIS — K621 Rectal polyp: Secondary | ICD-10-CM | POA: Diagnosis present

## 2017-01-22 DIAGNOSIS — R739 Hyperglycemia, unspecified: Secondary | ICD-10-CM | POA: Diagnosis present

## 2017-01-22 DIAGNOSIS — D129 Benign neoplasm of anus and anal canal: Principal | ICD-10-CM | POA: Diagnosis present

## 2017-01-22 DIAGNOSIS — K635 Polyp of colon: Secondary | ICD-10-CM | POA: Diagnosis present

## 2017-01-22 DIAGNOSIS — K640 First degree hemorrhoids: Secondary | ICD-10-CM | POA: Diagnosis present

## 2017-01-22 DIAGNOSIS — D62 Acute posthemorrhagic anemia: Secondary | ICD-10-CM | POA: Diagnosis present

## 2017-01-22 DIAGNOSIS — Z87891 Personal history of nicotine dependence: Secondary | ICD-10-CM

## 2017-01-22 DIAGNOSIS — K639 Disease of intestine, unspecified: Secondary | ICD-10-CM | POA: Diagnosis not present

## 2017-01-22 DIAGNOSIS — E785 Hyperlipidemia, unspecified: Secondary | ICD-10-CM | POA: Diagnosis present

## 2017-01-22 DIAGNOSIS — I10 Essential (primary) hypertension: Secondary | ICD-10-CM | POA: Diagnosis present

## 2017-01-22 DIAGNOSIS — K625 Hemorrhage of anus and rectum: Secondary | ICD-10-CM | POA: Diagnosis present

## 2017-01-22 DIAGNOSIS — D369 Benign neoplasm, unspecified site: Secondary | ICD-10-CM | POA: Diagnosis not present

## 2017-01-22 DIAGNOSIS — Z7902 Long term (current) use of antithrombotics/antiplatelets: Secondary | ICD-10-CM

## 2017-01-22 DIAGNOSIS — D125 Benign neoplasm of sigmoid colon: Secondary | ICD-10-CM | POA: Diagnosis present

## 2017-01-22 DIAGNOSIS — R9439 Abnormal result of other cardiovascular function study: Secondary | ICD-10-CM | POA: Diagnosis not present

## 2017-01-22 HISTORY — DX: Hyperlipidemia, unspecified: E78.5

## 2017-01-22 HISTORY — DX: Acute embolism and thrombosis of unspecified deep veins of unspecified lower extremity: I82.409

## 2017-01-22 LAB — CBC
HCT: 34 % — ABNORMAL LOW (ref 39.0–52.0)
HCT: 32.4 % — ABNORMAL LOW (ref 39.0–52.0)
HEMOGLOBIN: 10.3 g/dL — AB (ref 13.0–17.0)
Hemoglobin: 10.7 g/dL — ABNORMAL LOW (ref 13.0–17.0)
MCH: 27.9 pg (ref 26.0–34.0)
MCH: 28.9 pg (ref 26.0–34.0)
MCHC: 31.5 g/dL (ref 30.0–36.0)
MCHC: 31.8 g/dL (ref 30.0–36.0)
MCV: 88.5 fL (ref 78.0–100.0)
MCV: 90.8 fL (ref 78.0–100.0)
PLATELETS: 242 10*3/uL (ref 150–400)
Platelets: 207 10*3/uL (ref 150–400)
RBC: 3.84 MIL/uL — AB (ref 4.22–5.81)
RBC: 3.57 MIL/uL — AB (ref 4.22–5.81)
RDW: 13.4 % (ref 11.5–15.5)
RDW: 13.6 % (ref 11.5–15.5)
WBC: 6.1 10*3/uL (ref 4.0–10.5)
WBC: 6.6 10*3/uL (ref 4.0–10.5)

## 2017-01-22 LAB — POC OCCULT BLOOD, ED: FECAL OCCULT BLD: POSITIVE — AB

## 2017-01-22 LAB — COMPREHENSIVE METABOLIC PANEL
ALT: 18 U/L (ref 17–63)
AST: 19 U/L (ref 15–41)
Albumin: 4.3 g/dL (ref 3.5–5.0)
Alkaline Phosphatase: 63 U/L (ref 38–126)
Anion gap: 8 (ref 5–15)
BILIRUBIN TOTAL: 1 mg/dL (ref 0.3–1.2)
BUN: 14 mg/dL (ref 6–20)
CALCIUM: 9.4 mg/dL (ref 8.9–10.3)
CO2: 28 mmol/L (ref 22–32)
CREATININE: 1.08 mg/dL (ref 0.61–1.24)
Chloride: 105 mmol/L (ref 101–111)
GFR calc non Af Amer: >60 mL/min (ref >60)
Glucose, Bld: 113 mg/dL — ABNORMAL HIGH (ref 65–99)
Potassium: 3.5 mmol/L (ref 3.5–5.1)
Sodium: 141 mmol/L (ref 135–145)
TOTAL PROTEIN: 7.7 g/dL (ref 6.5–8.1)

## 2017-01-22 LAB — ABO/RH: ABO/RH(D): A POS

## 2017-01-22 MED ORDER — LISINOPRIL 20 MG PO TABS
20.0000 mg | ORAL_TABLET | Freq: Every day | ORAL | Status: DC
  Administered 2017-01-23 – 2017-01-29 (×6): 20 mg via ORAL
  Filled 2017-01-22 (×6): qty 1

## 2017-01-22 MED ORDER — HYDROCHLOROTHIAZIDE 12.5 MG PO CAPS
12.5000 mg | ORAL_CAPSULE | Freq: Every day | ORAL | Status: DC
  Administered 2017-01-23 – 2017-01-29 (×6): 12.5 mg via ORAL
  Filled 2017-01-22 (×6): qty 1

## 2017-01-22 MED ORDER — ONDANSETRON HCL 4 MG/2ML IJ SOLN
4.0000 mg | Freq: Four times a day (QID) | INTRAMUSCULAR | Status: DC | PRN
  Administered 2017-01-23: 4 mg via INTRAVENOUS
  Filled 2017-01-22: qty 2

## 2017-01-22 MED ORDER — ACETAMINOPHEN 325 MG PO TABS
650.0000 mg | ORAL_TABLET | Freq: Four times a day (QID) | ORAL | Status: DC | PRN
  Administered 2017-01-28: 650 mg via ORAL
  Filled 2017-01-22: qty 2

## 2017-01-22 MED ORDER — SODIUM CHLORIDE 0.9 % IV BOLUS (SEPSIS)
1000.0000 mL | Freq: Once | INTRAVENOUS | Status: AC
  Administered 2017-01-22: 1000 mL via INTRAVENOUS

## 2017-01-22 MED ORDER — LISINOPRIL-HYDROCHLOROTHIAZIDE 20-12.5 MG PO TABS: 1.0000 | ORAL_TABLET | Freq: Every day | ORAL | Status: DC

## 2017-01-22 MED ORDER — LACTATED RINGERS IV SOLN
INTRAVENOUS | Status: DC
  Administered 2017-01-22: 1000 mL via INTRAVENOUS
  Administered 2017-01-23: 15:00:00 via INTRAVENOUS
  Administered 2017-01-23 (×2): 1000 mL via INTRAVENOUS
  Administered 2017-01-24: 125 mL/h via INTRAVENOUS
  Administered 2017-01-24: 1000 mL via INTRAVENOUS

## 2017-01-22 MED ORDER — ONDANSETRON HCL 4 MG PO TABS: 4.0000 mg | ORAL_TABLET | Freq: Four times a day (QID) | ORAL | Status: DC | PRN

## 2017-01-22 MED ORDER — AMLODIPINE BESYLATE 10 MG PO TABS
10.0000 mg | ORAL_TABLET | Freq: Every day | ORAL | Status: DC
  Administered 2017-01-23 – 2017-01-29 (×6): 10 mg via ORAL
  Filled 2017-01-22 (×6): qty 1

## 2017-01-22 MED ORDER — ASPIRIN 81 MG PO CHEW
81.0000 mg | CHEWABLE_TABLET | Freq: Every day | ORAL | Status: DC
Start: 2017-01-23 — End: 2017-01-25
  Administered 2017-01-23: 81 mg via ORAL
  Filled 2017-01-22 (×2): qty 1

## 2017-01-22 MED ORDER — ACETAMINOPHEN 650 MG RE SUPP: 650.0000 mg | Freq: Four times a day (QID) | RECTAL | Status: DC | PRN

## 2017-01-22 MED ORDER — ROSUVASTATIN CALCIUM 20 MG PO TABS
20.0000 mg | ORAL_TABLET | Freq: Every day | ORAL | Status: DC
  Administered 2017-01-23 – 2017-01-28 (×5): 20 mg via ORAL
  Filled 2017-01-22 (×5): qty 1

## 2017-01-22 NOTE — ED Provider Notes (Signed)
Joseph [name]   CSN: [id number] Arrival date & time: 01/22/17  1552     History   Chief Complaint Chief Complaint  Patient presents with  . Rectal Bleeding    HPI Joseph [name] is a 56 y.o. male hx of DVT, HTN, claudication on plvaix here with rectal bleeding. Patient states that he woke around 1 AM and felt like he was going to have a bowel movement and then had some blood in his stool. He had about 5 episodes of blood in his stool before going to work and another 3 at work. Went to see his doctor and was sent here for GI bleeding. Patient never had a colonoscopy in the past. Patient is currently on Plavix and baby aspirin. Patient is not currently on any other blood thinners. Denies any abdominal pain.   The history is provided by the patient.    Past Medical History:  Diagnosis Date  . Asthma    child  . DVT (deep venous thrombosis) ([place])   . Hypertension   . Inguinal hernia    LIH  . Pilonidal cyst   . Rectal bleeding   . Tuberculosis    tb  dx 3-4    Patient Active Problem List   Diagnosis Date Noted  . Atypical chest pain 07/01/2016  . Accelerated hypertension 07/01/2016  . Nonspecific abnormal electrocardiogram (ECG) (EKG) 07/01/2016  . Mild hyperlipidemia 07/01/2016  . Tobacco use disorder 07/01/2016  . Congenital hip dysplasia 07/01/2016  . Aortic atherosclerosis ([place]) 07/01/2016  . Claudication of left lower extremity ([place]) 07/01/2016  . Inguinal hernia unilateral, non-recurrent 08/08/2011    Past Surgical History:  Procedure Laterality Date  . INGUINAL HERNIA REPAIR  09/07/2011   Procedure: HERNIA REPAIR INGUINAL ADULT;  Surgeon: Rolm Bookbinder, MD;  Location: [facility];  Service: General;  Laterality: Left;  . PERIPHERAL VASCULAR CATHETERIZATION N/A 07/25/2016   Procedure: Lower Extremity Angiography;  Surgeon: Adrian Prows, MD;  Location: [facility];  Service: Cardiovascular;  Laterality: N/A;  . PILONIDAL CYST  EXCISION         Home Medications    Prior to Admission medications   Medication Sig Start Date End Date Taking? Authorizing Provider  amLODipine (NORVASC) 10 MG tablet Take 10 mg by mouth daily. 06/21/16  Yes Historical Provider, MD  aspirin 81 MG chewable tablet Chew 81 mg by mouth daily.   Yes Historical Provider, MD  clopidogrel (PLAVIX) 75 MG tablet Take 1 tablet (75 mg total) by mouth daily. 07/25/16  Yes Adrian Prows, MD  ibuprofen (ADVIL,MOTRIN) 200 MG tablet Take 200 mg by mouth every 6 (six) hours as needed (for pain.).   Yes Historical Provider, MD  lisinopril-hydrochlorothiazide (PRINZIDE,ZESTORETIC) 20-12.5 MG tablet TAKE 1 TABLET BY MOUTH DAILY. 07/18/16  Yes Stephanie D English, PA  rosuvastatin (CRESTOR) 20 MG tablet Take 20 mg by mouth daily. 01/07/17  Yes Historical Provider, MD    Family History No family history on file.  Social History Social History  Substance Use Topics  . Smoking status: Current Every Day Smoker    Packs/day: 0.50  . Smokeless tobacco: Not on file  . Alcohol use Yes     Allergies   Patient has no known allergies.   Review of Systems Review of Systems  Gastrointestinal: Positive for blood in stool and hematochezia.  All other systems reviewed and are negative.    Physical Exam Updated Vital Signs BP 126/88 (BP Location: Left Arm)   Pulse 65  Temp 97.9 F (36.6 C) (Oral)   Resp 16   SpO2 100%   Physical Exam  Constitutional: He is oriented to person, place, and time. He appears well-developed.  HENT:  Head: Normocephalic.  Mouth/Throat: Oropharynx is clear and moist.  Eyes: EOM are normal. Pupils are equal, round, and reactive to light.  Neck: Normal range of motion. Neck supple.  Cardiovascular: Normal rate, regular rhythm and normal heart sounds.   Pulmonary/Chest: Effort normal and breath sounds normal. No respiratory distress. He has no wheezes.  Abdominal: Soft. Bowel sounds are normal. He exhibits no distension. There  is no tenderness. There is no guarding.  Genitourinary:  Genitourinary Comments: Rectal- bright red blood, no hemorrhoids   Musculoskeletal: Normal range of motion.  Neurological: He is alert and oriented to person, place, and time.  Skin: Skin is warm.  Psychiatric: He has a normal mood and affect.  Nursing [name] and vitals reviewed.    ED Treatments / Results  Labs (all labs ordered are listed, but only abnormal results are displayed) Labs Reviewed  COMPREHENSIVE METABOLIC PANEL - Abnormal; Notable for the following:       Result Value   Glucose, Bld 113 (*)    All other components within normal limits  CBC - Abnormal; Notable for the following:    RBC 3.84 (*)    Hemoglobin 10.7 (*)    HCT 34.0 (*)    All other components within normal limits  POC OCCULT BLOOD, ED - Abnormal; Notable for the following:    Fecal Occult Bld POSITIVE (*)    All other components within normal limits  POC OCCULT BLOOD, ED  TYPE AND SCREEN  ABO/RH    EKG  EKG Interpretation None       Radiology No results found.  Procedures Procedures (including critical care time)  Medications Ordered in ED Medications  sodium chloride 0.9 % bolus 1,000 mL (not administered)     Initial Impression / Assessment and Plan / ED Course  I have reviewed the triage vital signs and the nursing notes.  Pertinent labs & imaging results that were available during my care of the patient were reviewed by me and considered in my medical decision making (see chart for details).     Joseph [name] is a 56 y.o. male here with rectal bleeding. Patient on plavix, ASA. No nausea or vomiting. Not orthostatic. Hg 10.7, was 13 last year. Given 1 L NS bolus. Called Dr. Michail Sermon, who will see patient in AM. Hospitalist to admit.    Final Clinical Impressions(s) / ED Diagnoses   Final diagnoses:  None    New Prescriptions New Prescriptions   No medications on file     Drenda Freeze, MD 01/22/17  2049

## 2017-01-22 NOTE — H&P (Signed)
History and Physical    Joseph [name] WNU:[id number] DOB: 12/05/1960 DOA: 01/22/2017  PCP: No PCP Per Patient Consultants:  Einar Gip - cardiology Patient coming from: home - lives with wife; NOK: wife, [phone]  Chief Complaint: GI bleeding  HPI: Joseph [name] is a 56 y.o. male with medical history significant of HTN, HLD, and claudication/PVD presenting with lower GI bleeding.  He reports going to the bathroom and blood coming out.  Started about 0100 this AM.  Starts with liquid, then blood clots.  Maybe 10 total episodes, 3-4 times in the ER and needs to go now.  Never had something similar.  Dark red blood.  No pain, just can tell it is coming and a bit of soreness from going so much.  Not light-headed or dizziness with standing. No SOB.  Not opposed to receiving blood, If needed.  He saw Dr. Einar Gip in 10/17 and had an exercise sestamibi stress test which was an intermediate risk study.  He also was diagnosed with accelerated HTN and started on Lisinopril-HCTZ; claudication with subsequent angiography and PTA, started on Plavix and ASA; and HLD, started on Crestor.  ED Course: Normal vitals, Hgb 10.7, prior 13.  Given 1L bolus.  Dr. Michail Sermon to see in AM.   Review of Systems: As per HPI; otherwise review of systems reviewed and negative.   Ambulatory Status:  Ambulates without assistance  Past Medical History:  Diagnosis Date  . Asthma    child  . DVT (deep venous thrombosis) ([place])   . Hyperlipidemia   . Hypertension   . Inguinal hernia    LIH  . Pilonidal cyst   . Rectal bleeding   . Tuberculosis    tb  dx 3-4    Past Surgical History:  Procedure Laterality Date  . INGUINAL HERNIA REPAIR  09/07/2011   Procedure: HERNIA REPAIR INGUINAL ADULT;  Surgeon: Rolm Bookbinder, MD;  Location: [facility];  Service: General;  Laterality: Left;  . PERIPHERAL VASCULAR CATHETERIZATION N/A 07/25/2016   Procedure: Lower Extremity Angiography;  Surgeon: Adrian Prows, MD;   Location: [facility];  Service: Cardiovascular;  Laterality: N/A;  . PILONIDAL CYST EXCISION      Social History   Social History  . Marital status: Married    Spouse name: N/A  . Number of children: N/A  . Years of education: N/A   Occupational History  . custodian    Social History Main Topics  . Smoking status: Former Smoker    Packs/day: 0.50    Years: 15.00    Quit date: 05/22/2016  . Smokeless tobacco: Never Used  . Alcohol use Yes     Comment: 2 or 3 on weekend  . Drug use: No  . Sexual activity: Yes   Other Topics Concern  . Not on file   Social History Narrative  . No narrative on file    No Known Allergies  History reviewed. No pertinent family history.  Prior to Admission medications   Medication Sig Start Date End Date Taking? Authorizing Provider  amLODipine (NORVASC) 10 MG tablet Take 10 mg by mouth daily. 06/21/16  Yes Historical Provider, MD  aspirin 81 MG chewable tablet Chew 81 mg by mouth daily.   Yes Historical Provider, MD  clopidogrel (PLAVIX) 75 MG tablet Take 1 tablet (75 mg total) by mouth daily. 07/25/16  Yes Adrian Prows, MD  ibuprofen (ADVIL,MOTRIN) 200 MG tablet Take 200 mg by mouth every 6 (six) hours as needed (for pain.).  Yes Historical Provider, MD  lisinopril-hydrochlorothiazide (PRINZIDE,ZESTORETIC) 20-12.5 MG tablet TAKE 1 TABLET BY MOUTH DAILY. 07/18/16  Yes Stephanie D English, PA  rosuvastatin (CRESTOR) 20 MG tablet Take 20 mg by mouth daily. 01/07/17  Yes Historical Provider, MD    Physical Exam: Vitals:   01/22/17 2005 01/22/17 2110 01/22/17 2221 01/22/17 2224  BP: (!) 143/88 (!) 144/91 118/71   Pulse: (!) 57 (!) 59 (!) 57   Resp: 19 13 12    Temp:   97.9 F (36.6 C)   TempSrc:   Oral   SpO2: 100% 98% 100%   Weight:    103 kg (227 lb)  Height:    5\' 11"  (1.803 m)     General: Appears calm and comfortable and is NAD Eyes:  PERRL, EOMI, normal lids, iris ENT:  grossly normal hearing, lips & tongue, mmm Neck:  no  LAD, masses or thyromegaly Cardiovascular:  RRR, no m/r/g. No LE edema.  Respiratory:  CTA bilaterally, no w/r/r. Normal respiratory effort. Abdomen:  soft, ntnd, NABS Skin:  no rash or induration seen on limited exam Musculoskeletal:  grossly normal tone BUE/BLE, good ROM, no bony abnormality Psychiatric:  grossly normal mood and affect, speech fluent and appropriate, AOx3 Neurologic:  CN 2-12 grossly intact, moves all extremities in coordinated fashion, sensation intact  Labs on Admission: I have personally reviewed following labs and imaging studies  CBC:  Recent Labs Lab 01/22/17 1637 01/22/17 2234  WBC 6.1 6.6  HGB 10.7* 10.3*  HCT 34.0* 32.4*  MCV 88.5 90.8  PLT 242 989   Basic Metabolic Panel:  Recent Labs Lab 01/22/17 1637  NA 141  K 3.5  CL 105  CO2 28  GLUCOSE 113*  BUN 14  CREATININE 1.08  CALCIUM 9.4   GFR: Estimated Creatinine Clearance: 95.6 mL/min (by C-G formula based on SCr of 1.08 mg/dL). Liver Function Tests:  Recent Labs Lab 01/22/17 1637  AST 19  ALT 18  ALKPHOS 63  BILITOT 1.0  PROT 7.7  ALBUMIN 4.3   No results for input(s): LIPASE, AMYLASE in the last 168 hours. No results for input(s): AMMONIA in the last 168 hours. Coagulation Profile: No results for input(s): INR, PROTIME in the last 168 hours. Cardiac Enzymes: No results for input(s): CKTOTAL, CKMB, CKMBINDEX, TROPONINI in the last 168 hours. BNP (last 3 results) No results for input(s): PROBNP in the last 8760 hours. HbA1C: No results for input(s): HGBA1C in the last 72 hours. CBG: No results for input(s): GLUCAP in the last 168 hours. Lipid Profile: No results for input(s): CHOL, HDL, LDLCALC, TRIG, CHOLHDL, LDLDIRECT in the last 72 hours. Thyroid Function Tests: No results for input(s): TSH, T4TOTAL, FREET4, T3FREE, THYROIDAB in the last 72 hours. Anemia Panel: No results for input(s): VITAMINB12, FOLATE, FERRITIN, TIBC, IRON, RETICCTPCT in the last 72 hours. Urine  analysis:    Component Value Date/Time   BILIRUBINUR negative 05/26/2016 1144   KETONESUR negative 05/26/2016 1144   PROTEINUR negative 05/26/2016 1144   UROBILINOGEN 0.2 05/26/2016 1144   NITRITE Negative 05/26/2016 1144   LEUKOCYTESUR Negative 05/26/2016 1144    Creatinine Clearance: Estimated Creatinine Clearance: 95.6 mL/min (by C-G formula based on SCr of 1.08 mg/dL).  Sepsis Labs: @LABRCNTIP (procalcitonin:4,lacticidven:4) )No results found for this or any previous visit (from the past 240 hour(s)).   Radiological Exams on Admission: No results found.  EKG: not done  Assessment/Plan Principal Problem:   GI bleed Active Problems:   Accelerated hypertension   Mild hyperlipidemia  Claudication of left lower extremity (HCC)   GI bleed -Differential to include bleeding hemorrhoids, diverticular bleeding,and AVM  -Patient has never had a colonoscopy -Hgb 10.7, repeat 10.3; 13.6 in 8/17 -Heme positive -Admit  -Type and cross done in the ER -I reviewed transfusion information and consented the patient for blood, if needed; would transfuse for Hgb <7 -Check q6h CBC  -Continue to monitor for recurrent bleeding  -blood culture if patient becomes febrile -2 large-bore IVs -Clear liquid diet -GI to see patient in the AM, likely to need colonoscopy after bowel prep -For now, hold Plavix but continue ASA.  HTN -Continue Norvasc, hold HTCZ/Lisinopril for now  HLD -Continue Crestor  Claudication -As above, continue ASA but hold Plavix for now -Patient has stopped smoking  Hyperglycemia -Glucose 113  DVT prophylaxis: SCDs Code Status:  Full - confirmed with patient Family Communication: None present Disposition Plan:  Home once clinically improved Consults called: GI - to see in AM  Admission status: Admit - It is my clinical opinion that admission to INPATIENT is reasonable and necessary because this patient will require at least 2 midnights in the hospital to  treat this condition based on the medical complexity of the problems presented.  Given the aforementioned information, the predictability of an adverse outcome is felt to be significant.    Karmen Bongo MD [facility]  If 7PM-7AM, please contact night-coverage [email or web address] Password [facility]  01/22/2017, 11:55 PM

## 2017-01-22 NOTE — ED Notes (Signed)
Admitting dr at bedside  Warm blanket given

## 2017-01-22 NOTE — ED Triage Notes (Signed)
Per pt states rectal bleeding since this am-has had 8-9 loose bloody BMs-PCP sent him here for eval-is on blood thinner-states no pain, dizziness, SOB

## 2017-01-23 DIAGNOSIS — K922 Gastrointestinal hemorrhage, unspecified: Secondary | ICD-10-CM

## 2017-01-23 DIAGNOSIS — I739 Peripheral vascular disease, unspecified: Secondary | ICD-10-CM

## 2017-01-23 DIAGNOSIS — I10 Essential (primary) hypertension: Secondary | ICD-10-CM

## 2017-01-23 DIAGNOSIS — D62 Acute posthemorrhagic anemia: Secondary | ICD-10-CM

## 2017-01-23 LAB — HIV ANTIBODY (ROUTINE TESTING W REFLEX): HIV Screen 4th Generation wRfx: NONREACTIVE

## 2017-01-23 LAB — BASIC METABOLIC PANEL
Anion gap: 7 (ref 5–15)
BUN: 12 mg/dL (ref 6–20)
CHLORIDE: 106 mmol/L (ref 101–111)
CO2: 27 mmol/L (ref 22–32)
Calcium: 8.7 mg/dL — ABNORMAL LOW (ref 8.9–10.3)
Creatinine, Ser: 0.94 mg/dL (ref 0.61–1.24)
GFR calc Af Amer: >60 mL/min (ref >60)
GFR calc non Af Amer: >60 mL/min (ref >60)
GLUCOSE: 110 mg/dL — AB (ref 65–99)
POTASSIUM: 3.2 mmol/L — AB (ref 3.5–5.1)
Sodium: 140 mmol/L (ref 135–145)

## 2017-01-23 LAB — CBC
HCT: 29.1 % — ABNORMAL LOW (ref 39.0–52.0)
HCT: 27.9 % — ABNORMAL LOW (ref 39.0–52.0)
HEMATOCRIT: 28.8 % — AB (ref 39.0–52.0)
HEMOGLOBIN: 9.3 g/dL — AB (ref 13.0–17.0)
Hemoglobin: 9.3 g/dL — ABNORMAL LOW (ref 13.0–17.0)
Hemoglobin: 9 g/dL — ABNORMAL LOW (ref 13.0–17.0)
MCH: 29.2 pg (ref 26.0–34.0)
MCH: 29.1 pg (ref 26.0–34.0)
MCH: 29.4 pg (ref 26.0–34.0)
MCHC: 32.3 g/dL (ref 30.0–36.0)
MCHC: 32 g/dL (ref 30.0–36.0)
MCHC: 32.3 g/dL (ref 30.0–36.0)
MCV: 90.6 fL (ref 78.0–100.0)
MCV: 90.9 fL (ref 78.0–100.0)
MCV: 91.2 fL (ref 78.0–100.0)
PLATELETS: 188 10*3/uL (ref 150–400)
Platelets: 179 10*3/uL (ref 150–400)
Platelets: 195 10*3/uL (ref 150–400)
RBC: 3.18 MIL/uL — ABNORMAL LOW (ref 4.22–5.81)
RBC: 3.2 MIL/uL — ABNORMAL LOW (ref 4.22–5.81)
RBC: 3.06 MIL/uL — ABNORMAL LOW (ref 4.22–5.81)
RDW: 13.7 % (ref 11.5–15.5)
RDW: 13.5 % (ref 11.5–15.5)
RDW: 13.6 % (ref 11.5–15.5)
WBC: 4.9 10*3/uL (ref 4.0–10.5)
WBC: 5.4 10*3/uL (ref 4.0–10.5)
WBC: 6 10*3/uL (ref 4.0–10.5)

## 2017-01-23 MED ORDER — LIP MEDEX EX OINT
TOPICAL_OINTMENT | CUTANEOUS | Status: AC
  Administered 2017-01-23: 11:00:00
  Filled 2017-01-23: qty 7

## 2017-01-23 MED ORDER — SODIUM CHLORIDE 0.9 % IV SOLN
INTRAVENOUS | Status: DC
  Administered 2017-01-24 – 2017-01-27 (×2): 20 mL/h via INTRAVENOUS
  Administered 2017-01-28 (×2): via INTRAVENOUS

## 2017-01-23 MED ORDER — PEG 3350-KCL-NA BICARB-NACL 420 G PO SOLR
4000.0000 mL | Freq: Once | ORAL | Status: AC
  Administered 2017-01-23: 4000 mL via ORAL

## 2017-01-23 NOTE — Progress Notes (Signed)
PROGRESS NOTE  [name]  JIR:[id number] DOB: Nov 11, 1960 DOA: 01/22/2017 PCP: No PCP Per Patient   Brief Narrative: Joseph [name] is a 56 y.o. male with a history of HTN, HLD, and claudication/PVD on plavix presenting with lower GI bleeding. He reported at least 10 episodes of dark red blood in liquid stool in the previous 12 hours without abdominal pain, vomiting. He reported some dizziness without syncope. Vitals were stable. Hgb was 10.7 from prior of 13. he was admitted and GI is planning colonoscopy 4/18.     Assessment & Plan: Principal Problem:   GI bleed Active Problems:   Accelerated hypertension   Mild hyperlipidemia   Claudication of left lower extremity (HCC)  GI bleed: Likely lower GI bleeding. No h/o colonoscopy.  - Colonoscopy 4/18.  - Serial CBC, transfuse for <8 given active bleeding. Given h/o intermediate exercise stress test, could consider threshold of 9.  - Clear liquid diet, NPO p MN - Continue ASA, holding plavix  HTN: Cardiologist is Dr. Einar Gip.  -Continue norvasc, hold HTCZ/lisinopril given bleeding. Normotensive.   HLD -Continue Crestor  PVD:  - As above, continue ASA but hold Plavix for now - Patient has stopped smoking  Hyperglycemia -Glucose 113  DVT prophylaxis: SCDs Code Status: Full Family Communication: Wife at bedside Disposition Plan: [place] home once stable.   Consultants:   GI, Dr. Michail Sermon  Procedures:   Colonoscopy 01/24/2017:  Antimicrobials:  None   Subjective: Pt reports ongoing red blood from rectum today. No light-headedness, dizziness, chest pain, dyspnea, palpitations. No nausea or vomiting, just some lower abdominal discomfort associated with BMs.   Objective: Vitals:   01/23/17 0545 01/23/17 1015 01/23/17 1404 01/23/17 1800  BP: 115/75 130/76 116/74 122/72  Pulse: (!) 57 61 62 74  Resp: 15  16 18   Temp: 97.5 F (36.4 C) 97.6 F (36.4 C) 97.6 F (36.4 C) 98 F (36.7 C)    TempSrc: Oral Oral Oral Oral  SpO2: 99% 100% 100% 100%  Weight:      Height:        Intake/Output Summary (Last 24 hours) at 01/23/17 2024 Last data filed at 01/23/17 1800  Gross per 24 hour  Intake          3598.75 ml  Output              401 ml  Net          3197.75 ml   Filed Weights   01/22/17 2224  Weight: 103 kg (227 lb)    Examination: General exam: 56 y.o. male in no distress  Respiratory system: Non-labored breathing room air. Clear to auscultation bilaterally.  Cardiovascular system: Regular rate and rhythm. No murmur, rub, or gallop. No JVD, and no pedal edema. Gastrointestinal system: Abdomen soft, non-tender, non-distended, with normoactive bowel sounds. No organomegaly or masses felt. Central nervous system: Alert and oriented. No focal neurological deficits. Extremities: Warm, no deformities Skin: No rashes, lesions no ulcers Psychiatry: Judgement and insight appear normal. Mood & affect appropriate.   Data Reviewed: I have personally reviewed following labs and imaging studies  CBC:  Recent Labs Lab 01/22/17 1637 01/22/17 2234 01/23/17 0500 01/23/17 1251  WBC 6.1 6.6 4.9 5.4  HGB 10.7* 10.3* 9.3* 9.3*  HCT 34.0* 32.4* 28.8* 29.1*  MCV 88.5 90.8 90.6 90.9  PLT 242 207 179 751   Basic Metabolic Panel:  Recent Labs Lab 01/22/17 1637 01/23/17 0500  NA 141 140  K 3.5 3.2*  CL  105 106  CO2 28 27  GLUCOSE 113* 110*  BUN 14 12  CREATININE 1.08 0.94  CALCIUM 9.4 8.7*   GFR: Estimated Creatinine Clearance: 109.8 mL/min (by C-G formula based on SCr of 0.94 mg/dL). Liver Function Tests:  Recent Labs Lab 01/22/17 1637  AST 19  ALT 18  ALKPHOS 63  BILITOT 1.0  PROT 7.7  ALBUMIN 4.3   Radiology Studies: No results found.  Scheduled Meds: . amLODipine  10 mg Oral Daily  . aspirin  81 mg Oral Daily  . lisinopril  20 mg Oral Daily   And  . hydrochlorothiazide  12.5 mg Oral Daily  . rosuvastatin  20 mg Oral q1800   Continuous  Infusions: . sodium chloride    . lactated ringers 125 mL/hr at 01/23/17 1432     LOS: 1 day   Time spent: 25 minutes.  Vance Gather, MD [facility] Pager [phone]  If 7PM-7AM, please contact night-coverage [email or web address] Password [facility] 01/23/2017, 8:24 PM

## 2017-01-23 NOTE — Consult Note (Signed)
Referring Provider: Dr. Darl Householder Primary Care Physician:  No PCP Per Patient Primary Gastroenterologist:  Althia Forts  Reason for Consultation:  Rectal bleeding  HPI: Joseph [name] is a 56 y.o. male who had the acute onset of rectal bleeding described as red blood and clots that happened multiple times yesterday and several times today. Had loose stool with the bleeding today. Denies abdominal pain, nausea, vomiting, dizziness, melena, hematemesis. Recalls having rectal bleeding (smaller volume) back when he was diagnosed with a DVT and put on blood thinners. Has never had a colonoscopy. Hgb 10.7 on admit. BUN 14. On Aspirin and Plavix prior to admit.    Past Medical History:  Diagnosis Date  . Asthma    child  . DVT (deep venous thrombosis) ([place])   . Hyperlipidemia   . Hypertension   . Inguinal hernia    LIH  . Pilonidal cyst   . Rectal bleeding   . Tuberculosis    tb  dx 3-4    Past Surgical History:  Procedure Laterality Date  . INGUINAL HERNIA REPAIR  09/07/2011   Procedure: HERNIA REPAIR INGUINAL ADULT;  Surgeon: Rolm Bookbinder, MD;  Location: [facility];  Service: General;  Laterality: Left;  . PERIPHERAL VASCULAR CATHETERIZATION N/A 07/25/2016   Procedure: Lower Extremity Angiography;  Surgeon: Adrian Prows, MD;  Location: [facility];  Service: Cardiovascular;  Laterality: N/A;  . PILONIDAL CYST EXCISION      Prior to Admission medications   Medication Sig Start Date End Date Taking? Authorizing Provider  amLODipine (NORVASC) 10 MG tablet Take 10 mg by mouth daily. 06/21/16  Yes Historical Provider, MD  aspirin 81 MG chewable tablet Chew 81 mg by mouth daily.   Yes Historical Provider, MD  clopidogrel (PLAVIX) 75 MG tablet Take 1 tablet (75 mg total) by mouth daily. 07/25/16  Yes Adrian Prows, MD  ibuprofen (ADVIL,MOTRIN) 200 MG tablet Take 200 mg by mouth every 6 (six) hours as needed (for pain.).   Yes Historical Provider, MD  lisinopril-hydrochlorothiazide  (PRINZIDE,ZESTORETIC) 20-12.5 MG tablet TAKE 1 TABLET BY MOUTH DAILY. 07/18/16  Yes Stephanie D English, PA  rosuvastatin (CRESTOR) 20 MG tablet Take 20 mg by mouth daily. 01/07/17  Yes Historical Provider, MD    Scheduled Meds: . amLODipine  10 mg Oral Daily  . aspirin  81 mg Oral Daily  . lisinopril  20 mg Oral Daily   And  . hydrochlorothiazide  12.5 mg Oral Daily  . polyethylene glycol-electrolytes  4,000 mL Oral Once  . rosuvastatin  20 mg Oral q1800   Continuous Infusions: . sodium chloride    . lactated ringers 1,000 mL (01/23/17 0629)   PRN Meds:.acetaminophen **OR** acetaminophen, ondansetron **OR** ondansetron (ZOFRAN) IV  Allergies as of 01/22/2017  . (No Known Allergies)    History reviewed. No pertinent family history.  Social History   Social History  . Marital status: Married    Spouse name: N/A  . Number of children: N/A  . Years of education: N/A   Occupational History  . custodian    Social History Main Topics  . Smoking status: Former Smoker    Packs/day: 0.50    Years: 15.00    Quit date: 05/22/2016  . Smokeless tobacco: Never Used  . Alcohol use Yes     Comment: 2 or 3 on weekend  . Drug use: No  . Sexual activity: Yes   Other Topics Concern  . Not on file   Social History Narrative  . No  narrative on file    Review of Systems: All negative except as stated above in HPI.  Physical Exam: Vital signs: Vitals:   01/23/17 0545 01/23/17 1015  BP: 115/75 130/76  Pulse: (!) 57 61  Resp: 15   Temp: 97.5 F (36.4 C) 97.6 F (36.4 C)   Last BM Date: 01/23/17 General:   Alert,  Well-developed, well-nourished, pleasant and cooperative in NAD HEENT: anicteric sclera, oropharynx clear Lungs:  Clear throughout to auscultation.   No wheezes, crackles, or rhonchi. No acute distress. Heart:  Regular rate and rhythm; no murmurs, clicks, rubs,  or gallops. Abdomen: soft, nontender, nondistended, +BS  Rectal:  Deferred Ext: no edema  GI:  Lab  Results:  Recent Labs  01/22/17 1637 01/22/17 2234 01/23/17 0500  WBC 6.1 6.6 4.9  HGB 10.7* 10.3* 9.3*  HCT 34.0* 32.4* 28.8*  PLT 242 207 179   BMET  Recent Labs  01/22/17 1637 01/23/17 0500  NA 141 140  K 3.5 3.2*  CL 105 106  CO2 28 27  GLUCOSE 113* 110*  BUN 14 12  CREATININE 1.08 0.94  CALCIUM 9.4 8.7*   LFT  Recent Labs  01/22/17 1637  PROT 7.7  ALBUMIN 4.3  AST 19  ALT 18  ALKPHOS 63  BILITOT 1.0   PT/INR No results for input(s): LABPROT, INR in the last 72 hours.   Studies/Results: No results found.  Impression/Plan: 56 yo with profuse painless rectal bleeding likely due to a diverticular bleed. Will prep for colonoscopy to be done tomorrow. Clear liquid diet. NPO p MN. Colon prep this afternoon. Colonoscopy tomorrow with Propofol sedation.    LOS: 1 day   [place] C.  01/23/2017, 12:02 PM  Pager [phone]  AFTER 5 pm or on weekends please call [phone]

## 2017-01-24 ENCOUNTER — Encounter (HOSPITAL_COMMUNITY): Payer: Self-pay

## 2017-01-24 ENCOUNTER — Encounter (HOSPITAL_COMMUNITY)
Admission: EM | Disposition: A | Discharge status: Home / Self Care | Payer: Self-pay | Source: Home / Self Care | Attending: Family Medicine

## 2017-01-24 ENCOUNTER — Inpatient Hospital Stay (HOSPITAL_COMMUNITY): Payer: BC Managed Care – PPO | Admitting: Registered Nurse

## 2017-01-24 DIAGNOSIS — E785 Hyperlipidemia, unspecified: Secondary | ICD-10-CM

## 2017-01-24 HISTORY — PX: COLONOSCOPY WITH PROPOFOL: SHX5780

## 2017-01-24 LAB — CBC
HEMATOCRIT: 25.2 % — AB (ref 39.0–52.0)
Hemoglobin: 8.2 g/dL — ABNORMAL LOW (ref 13.0–17.0)
MCH: 29.6 pg (ref 26.0–34.0)
MCHC: 32.5 g/dL (ref 30.0–36.0)
MCV: 91 fL (ref 78.0–100.0)
PLATELETS: 194 10*3/uL (ref 150–400)
RBC: 2.77 MIL/uL — AB (ref 4.22–5.81)
RDW: 13.8 % (ref 11.5–15.5)
WBC: 6.6 10*3/uL (ref 4.0–10.5)

## 2017-01-24 SURGERY — COLONOSCOPY WITH PROPOFOL
Anesthesia: Monitor Anesthesia Care

## 2017-01-24 MED ORDER — PROPOFOL 10 MG/ML IV BOLUS
INTRAVENOUS | Status: AC
  Filled 2017-01-24: qty 20

## 2017-01-24 MED ORDER — LIDOCAINE 2% (20 MG/ML) 5 ML SYRINGE
INTRAMUSCULAR | Status: DC | PRN
  Administered 2017-01-24: 100 mg via INTRAVENOUS

## 2017-01-24 MED ORDER — LIDOCAINE 2% (20 MG/ML) 5 ML SYRINGE
INTRAMUSCULAR | Status: AC
  Filled 2017-01-24: qty 5

## 2017-01-24 MED ORDER — PROPOFOL 10 MG/ML IV BOLUS
INTRAVENOUS | Status: AC
  Filled 2017-01-24: qty 40

## 2017-01-24 MED ORDER — PROPOFOL 10 MG/ML IV BOLUS
INTRAVENOUS | Status: DC | PRN
  Administered 2017-01-24 (×3): 30 mg via INTRAVENOUS
  Administered 2017-01-24: 20 mg via INTRAVENOUS

## 2017-01-24 MED ORDER — FLEET ENEMA 7-19 GM/118ML RE ENEM
2.0000 | ENEMA | Freq: Once | RECTAL | Status: AC
  Administered 2017-01-24: 2 via RECTAL
  Filled 2017-01-24: qty 2

## 2017-01-24 MED ORDER — SPOT INK MARKER SYRINGE KIT
PACK | SUBMUCOSAL | Status: AC
  Filled 2017-01-24: qty 5

## 2017-01-24 MED ORDER — PROPOFOL 500 MG/50ML IV EMUL
INTRAVENOUS | Status: DC | PRN
  Administered 2017-01-24: 140 ug/kg/min via INTRAVENOUS

## 2017-01-24 MED ORDER — EPHEDRINE SULFATE-NACL 50-0.9 MG/10ML-% IV SOSY
PREFILLED_SYRINGE | INTRAVENOUS | Status: DC | PRN
  Administered 2017-01-24 (×3): 5 mg via INTRAVENOUS

## 2017-01-24 MED ORDER — SPOT INK MARKER SYRINGE KIT
PACK | SUBMUCOSAL | Status: DC | PRN
  Administered 2017-01-24: 10 mL via SUBMUCOSAL

## 2017-01-24 MED ORDER — EPHEDRINE 5 MG/ML INJ
INTRAVENOUS | Status: AC
  Filled 2017-01-24: qty 10

## 2017-01-24 SURGICAL SUPPLY — 22 items

## 2017-01-24 NOTE — Progress Notes (Signed)
PROGRESS NOTE  Joseph [name]  IPJ:[id number] DOB: 12-27-1960 DOA: 01/22/2017 PCP: No PCP Per Patient   Brief Narrative: Joseph [name] is a 56 y.o. male with a history of HTN, HLD, and claudication/PVD on plavix presenting with lower GI bleeding. He reported at least 10 episodes of dark red blood in liquid stool in the previous 12 hours without abdominal pain, vomiting. He reported some dizziness without syncope. Vitals were stable. Hgb was 10.7 from prior of 13. He was admitted and GI was consulted. He underwent colonoscopy 4/18 showing multiple polyps and internal hemorrhoids without active bleeding.    Assessment & Plan: Principal Problem:   GI bleed Active Problems:   Accelerated hypertension   Mild hyperlipidemia   Claudication of left lower extremity (HCC)  Lower GI bleed: Multiple polyps, internal hemorrhoids on colonoscopy - Follow up pathology from biopsies at colonoscopy 4/18.  - Serial CBC, transfuse for <8 given active bleeding. Given h/o intermediate exercise stress test, could consider threshold of 9.  - Clear liquid diet per GI - Continue ASA, holding plavix  HTN: Cardiologist is Dr. Einar Gip.  - Continue norvasc, hold HTCZ/lisinopril given bleeding. Normotensive.   HLD -Continue Crestor  PVD:  - As above, continue ASA but hold Plavix for now - Patient has stopped smoking  Hyperglycemia: Mild.  - Monitor with AM lab draw  DVT prophylaxis: SCDs Code Status: Full Family Communication: Wife at bedside Disposition Plan: [place] home once stable.   Consultants:   GI, Dr. Michail Sermon  Procedures:  Colonoscopy 01/24/2017 by Dr. Michail Sermon: - One polyp in the sigmoid colon at 20 cm proximal to the anus. Biopsied. Tattooed. - One 30 mm polyp in the sigmoid colon. Biopsied. - Rule out malignancy, tumor in the cecum. Biopsied. - One 20 mm polyp in the rectum. Biopsied. - One 10 mm polyp at the recto-sigmoid colon. - Internal hemorrhoids. - The  examined portion of the ileum was normal. Biopsied.  Antimicrobials:  None   Subjective: Tolerated bowel prep eventually, having had nausea and emesis earlier. Some red blood intermittently. No light-headedness, dizziness, chest pain, dyspnea, palpitations.   Objective: Vitals:   01/24/17 0530 01/24/17 1052 01/24/17 1132 01/24/17 1305  BP: 115/72 111/69 122/69 103/63  Pulse: 63 68 68 95  Resp: 18 18  14   Temp: 98.1 F (36.7 C) 98.6 F (37 C) 98.6 F (37 C) 97.6 F (36.4 C)  TempSrc: Oral Oral  Oral  SpO2: 100% 100% 100% 100%  Weight:      Height:        Intake/Output Summary (Last 24 hours) at 01/24/17 1346 Last data filed at 01/24/17 1306  Gross per 24 hour  Intake          3675.83 ml  Output                0 ml  Net          3675.83 ml   Filed Weights   01/22/17 2224  Weight: 103 kg (227 lb)    Examination: General exam: 56 y.o. male in no distress  Respiratory system: Non-labored breathing room air. Clear to auscultation bilaterally.  Cardiovascular system: Regular rate and rhythm. No murmur, rub, or gallop. No JVD, and no pedal edema. Gastrointestinal system: Abdomen soft, non-tender, non-distended, with normoactive bowel sounds. No organomegaly or masses felt. Central nervous system: Alert and oriented. No focal neurological deficits. Extremities: Warm, no deformities Skin: No rashes, lesions no ulcers Psychiatry: Judgement and insight appear normal. Mood &  affect appropriate.   Data Reviewed: I have personally reviewed following labs and imaging studies  CBC:  Recent Labs Lab 01/22/17 2234 01/23/17 0500 01/23/17 1251 01/23/17 2009 01/24/17 0523  WBC 6.6 4.9 5.4 6.0 6.6  HGB 10.3* 9.3* 9.3* 9.0* 8.2*  HCT 32.4* 28.8* 29.1* 27.9* 25.2*  MCV 90.8 90.6 90.9 91.2 91.0  PLT 207 179 195 188 270   Basic Metabolic Panel:  Recent Labs Lab 01/22/17 1637 01/23/17 0500  NA 141 140  K 3.5 3.2*  CL 105 106  CO2 28 27  GLUCOSE 113* 110*  BUN 14 12    CREATININE 1.08 0.94  CALCIUM 9.4 8.7*   GFR: Estimated Creatinine Clearance: 109.8 mL/min (by C-G formula based on SCr of 0.94 mg/dL). Liver Function Tests:  Recent Labs Lab 01/22/17 1637  AST 19  ALT 18  ALKPHOS 63  BILITOT 1.0  PROT 7.7  ALBUMIN 4.3   Radiology Studies: No results found.  Scheduled Meds: . [MAR Hold] amLODipine  10 mg Oral Daily  . [MAR Hold] aspirin  81 mg Oral Daily  . [MAR Hold] lisinopril  20 mg Oral Daily   And  . [MAR Hold] hydrochlorothiazide  12.5 mg Oral Daily  . [MAR Hold] rosuvastatin  20 mg Oral q1800   Continuous Infusions: . sodium chloride    . lactated ringers 1,000 mL (01/24/17 0551)     LOS: 2 days   Time spent: 25 minutes.  Vance Gather, MD [facility] Pager [phone]  If 7PM-7AM, please contact night-coverage [email or web address] Password [email or web address] 01/24/2017, 1:46 PM

## 2017-01-24 NOTE — Anesthesia Postprocedure Evaluation (Signed)
Anesthesia Post Note  Patient: Joseph [name]  Procedure(s) Performed: Procedure(s) (LRB): COLONOSCOPY WITH PROPOFOL (N/A)  Patient location during evaluation: PACU Anesthesia Type: MAC Level of consciousness: awake Pain management: pain level controlled Respiratory status: spontaneous breathing Cardiovascular status: stable Anesthetic complications: no       Last Vitals:  Vitals:   01/24/17 1305 01/24/17 1426  BP: 103/63 133/74  Pulse: 95 78  Resp: 14 16  Temp: 36.4 C 36.7 C    Last Pain:  Vitals:   01/24/17 1426  TempSrc: Oral  PainSc:                  Arel Tippen

## 2017-01-24 NOTE — Op Note (Signed)
[facility] Patient Name: Joseph [name] Procedure Date: 01/24/2017 MRN: [id number] Attending MD: Lear Ng , MD Date of Birth: 1960-12-06 CSN: [id number] Age: 56 Admit Type: Inpatient Procedure:                Colonoscopy Indications:              This is the patient's first colonoscopy,                            Hematochezia Providers:                Lear Ng, MD, Vista Lawman, RN, Cherylynn Ridges, Technician, Courtney Heys Armistead, CRNA Referring MD:              Medicines:                Propofol per Anesthesia, Monitored Anesthesia Care Complications:            No immediate complications. Estimated Blood Loss:     Estimated blood loss was minimal. Procedure:                Pre-Anesthesia Assessment:                           - Prior to the procedure, a History and Physical                            was performed, and patient medications and                            allergies were reviewed. The patient's tolerance of                            previous anesthesia was also reviewed. The risks                            and benefits of the procedure and the sedation                            options and risks were discussed with the patient.                            All questions were answered, and informed consent                            was obtained. Prior Anticoagulants: The patient has                            taken no previous anticoagulant or antiplatelet                            agents. ASA Grade Assessment: III - A patient with  severe systemic disease. After reviewing the risks                            and benefits, the patient was deemed in                            satisfactory condition to undergo the procedure.                           After obtaining informed consent, the colonoscope                            was passed under direct vision. Throughout the                      procedure, the patient's blood pressure, pulse, and                            oxygen saturations were monitored continuously. The                            was introduced through the anus and advanced to the                            the cecum, identified by appendiceal orifice and                            ileocecal valve. The colonoscopy was performed                            without difficulty. The patient tolerated the                            procedure well. The quality of the bowel                            preparation was adequate and good. The terminal                            ileum, ileocecal valve, appendiceal orifice, and                            rectum were photographed. Scope In: 12:29:47 PM Scope Out: 12:58:01 PM Scope Withdrawal Time: 0 hours 22 minutes 24 seconds  Total Procedure Duration: 0 hours 28 minutes 14 seconds  Findings:      The perianal and digital rectal examinations were normal.      A polyp was found in the sigmoid colon at 20 cm proximal to the anus.       The polyp was pedunculated. Biopsies were taken with a cold forceps for       histology. Estimated blood loss was minimal. Area was tattooed with an       injection of 9cc.      A 30 mm polyp was found in the sigmoid colon. The polyp was       pedunculated. Biopsies  were taken with a cold forceps for histology.       Estimated blood loss was minimal.      A frond-like/villous and polypoid non-obstructing large mass was found       in the cecum. The mass was non-circumferential. No bleeding was present.       Biopsies were taken with a cold forceps for histology. Estimated blood       loss was minimal.      A 20 mm polyp was found in the rectum. The polyp was pedunculated.       Biopsies were taken with a cold forceps for histology. Estimated blood       loss was minimal.      A 10 mm polyp was found in the recto-sigmoid colon. The polyp was       semi-pedunculated.       Internal hemorrhoids were found during retroflexion. The hemorrhoids       were small and Grade I (internal hemorrhoids that do not prolapse).      The terminal ileum appeared normal. Biopsies were taken with a cold       forceps for histology. Estimated blood loss was minimal. Impression:               - One polyp in the sigmoid colon at 20 cm proximal                            to the anus. Biopsied. Tattooed.                           - One 30 mm polyp in the sigmoid colon. Biopsied.                           - Rule out malignancy, tumor in the cecum. Biopsied.                           - One 20 mm polyp in the rectum. Biopsied.                           - One 10 mm polyp at the recto-sigmoid colon.                           - Internal hemorrhoids.                           - The examined portion of the ileum was normal.                            Biopsied. Moderate Sedation:      N/A- Per Anesthesia Care Recommendation:           - Clear liquid diet.                           - Await pathology results.                           - Refer to a surgeon at appointment to be scheduled.                           -  Post procedure medication orders were given.                           - Repeat colonoscopy for surveillance based on                            pathology results. Procedure Code(s):        --- Professional ---                           [phone], Colonoscopy, flexible; with directed                            submucosal injection(s), any substance                           82956, Colonoscopy, flexible; with biopsy, single                            or multiple Diagnosis Code(s):        --- Professional ---                           D49.0, Neoplasm of unspecified behavior of                            digestive system                           K92.1, Melena (includes Hematochezia)                           D12.5, Benign neoplasm of sigmoid colon                           K62.1,  Rectal polyp                           D12.7, Benign neoplasm of rectosigmoid junction                           K64.0, First degree hemorrhoids CPT copyright 2016 American [employer] Association. All rights reserved. The codes documented in this report are preliminary and upon coder review may  be revised to meet current compliance requirements. Lear Ng, MD 01/24/2017 1:20:43 PM This report has been signed electronically. Number of Addenda: 0

## 2017-01-24 NOTE — Brief Op Note (Signed)
Multiple large colon polyps and a proximal polypoid mass. See endopro note for details. Hold ASA/Plavix for now and ideally until removal but no obstruction from polyps so can be done as an outpt. Likely will need surgery for the proximal colon mass. Keep inpt until biopsies result (should have results by Friday). Denies family history of colon cancer. Ok to advance diet. Patient aware of results.

## 2017-01-24 NOTE — Anesthesia Preprocedure Evaluation (Addendum)
Anesthesia Evaluation  Patient identified by MRN, date of birth, ID band Patient awake    Reviewed: Allergy & Precautions, NPO status , Patient's Chart, lab work & pertinent test results  Airway Mallampati: II  TM Distance: >3 FB     Dental   Pulmonary asthma , former smoker,    breath sounds clear to auscultation       Cardiovascular hypertension, + Peripheral Vascular Disease   Rhythm:Regular Rate:Normal     Neuro/Psych    GI/Hepatic Neg liver ROS, History noted. CG   Endo/Other  negative endocrine ROS  Renal/GU negative Renal ROS     Musculoskeletal   Abdominal   Peds  Hematology   Anesthesia Other Findings   Reproductive/Obstetrics                             Anesthesia Physical Anesthesia Plan  ASA: III  Anesthesia Plan: MAC   Post-op Pain Management:    Induction: Intravenous  Airway Management Planned: Simple Face Mask  Additional Equipment:   Intra-op Plan:   Post-operative Plan:   Informed Consent: I have reviewed the patients History and Physical, chart, labs and discussed the procedure including the risks, benefits and alternatives for the proposed anesthesia with the patient or authorized representative who has indicated his/her understanding and acceptance.   Dental advisory given  Plan Discussed with: CRNA and Anesthesiologist  Anesthesia Plan Comments:         Anesthesia Quick Evaluation

## 2017-01-24 NOTE — H&P (View-Only) (Signed)
Referring Provider: Dr. Darl Householder Primary Care Physician:  No PCP Per Patient Primary Gastroenterologist:  Althia Forts  Reason for Consultation:  Rectal bleeding  HPI: Joseph [name] is a 56 y.o. male who had the acute onset of rectal bleeding described as red blood and clots that happened multiple times yesterday and several times today. Had loose stool with the bleeding today. Denies abdominal pain, nausea, vomiting, dizziness, melena, hematemesis. Recalls having rectal bleeding (smaller volume) back when he was diagnosed with a DVT and put on blood thinners. Has never had a colonoscopy. Hgb 10.7 on admit. BUN 14. On Aspirin and Plavix prior to admit.    Past Medical History:  Diagnosis Date  . Asthma    child  . DVT (deep venous thrombosis) ([place])   . Hyperlipidemia   . Hypertension   . Inguinal hernia    LIH  . Pilonidal cyst   . Rectal bleeding   . Tuberculosis    tb  dx 3-4    Past Surgical History:  Procedure Laterality Date  . INGUINAL HERNIA REPAIR  09/07/2011   Procedure: HERNIA REPAIR INGUINAL ADULT;  Surgeon: Rolm Bookbinder, MD;  Location: [facility];  Service: General;  Laterality: Left;  . PERIPHERAL VASCULAR CATHETERIZATION N/A 07/25/2016   Procedure: Lower Extremity Angiography;  Surgeon: Adrian Prows, MD;  Location: [facility];  Service: Cardiovascular;  Laterality: N/A;  . PILONIDAL CYST EXCISION      Prior to Admission medications   Medication Sig Start Date End Date Taking? Authorizing Provider  amLODipine (NORVASC) 10 MG tablet Take 10 mg by mouth daily. 06/21/16  Yes Historical Provider, MD  aspirin 81 MG chewable tablet Chew 81 mg by mouth daily.   Yes Historical Provider, MD  clopidogrel (PLAVIX) 75 MG tablet Take 1 tablet (75 mg total) by mouth daily. 07/25/16  Yes Adrian Prows, MD  ibuprofen (ADVIL,MOTRIN) 200 MG tablet Take 200 mg by mouth every 6 (six) hours as needed (for pain.).   Yes Historical Provider, MD  lisinopril-hydrochlorothiazide  (PRINZIDE,ZESTORETIC) 20-12.5 MG tablet TAKE 1 TABLET BY MOUTH DAILY. 07/18/16  Yes Stephanie D English, PA  rosuvastatin (CRESTOR) 20 MG tablet Take 20 mg by mouth daily. 01/07/17  Yes Historical Provider, MD    Scheduled Meds: . amLODipine  10 mg Oral Daily  . aspirin  81 mg Oral Daily  . lisinopril  20 mg Oral Daily   And  . hydrochlorothiazide  12.5 mg Oral Daily  . polyethylene glycol-electrolytes  4,000 mL Oral Once  . rosuvastatin  20 mg Oral q1800   Continuous Infusions: . sodium chloride    . lactated ringers 1,000 mL (01/23/17 0629)   PRN Meds:.acetaminophen **OR** acetaminophen, ondansetron **OR** ondansetron (ZOFRAN) IV  Allergies as of 01/22/2017  . (No Known Allergies)    History reviewed. No pertinent family history.  Social History   Social History  . Marital status: Married    Spouse name: N/A  . Number of children: N/A  . Years of education: N/A   Occupational History  . custodian    Social History Main Topics  . Smoking status: Former Smoker    Packs/day: 0.50    Years: 15.00    Quit date: 05/22/2016  . Smokeless tobacco: Never Used  . Alcohol use Yes     Comment: 2 or 3 on weekend  . Drug use: No  . Sexual activity: Yes   Other Topics Concern  . Not on file   Social History Narrative  . No  narrative on file    Review of Systems: All negative except as stated above in HPI.  Physical Exam: Vital signs: Vitals:   01/23/17 0545 01/23/17 1015  BP: 115/75 130/76  Pulse: (!) 57 61  Resp: 15   Temp: 97.5 F (36.4 C) 97.6 F (36.4 C)   Last BM Date: 01/23/17 General:   Alert,  Well-developed, well-nourished, pleasant and cooperative in NAD HEENT: anicteric sclera, oropharynx clear Lungs:  Clear throughout to auscultation.   No wheezes, crackles, or rhonchi. No acute distress. Heart:  Regular rate and rhythm; no murmurs, clicks, rubs,  or gallops. Abdomen: soft, nontender, nondistended, +BS  Rectal:  Deferred Ext: no edema  GI:  Lab  Results:  Recent Labs  01/22/17 1637 01/22/17 2234 01/23/17 0500  WBC 6.1 6.6 4.9  HGB 10.7* 10.3* 9.3*  HCT 34.0* 32.4* 28.8*  PLT 242 207 179   BMET  Recent Labs  01/22/17 1637 01/23/17 0500  NA 141 140  K 3.5 3.2*  CL 105 106  CO2 28 27  GLUCOSE 113* 110*  BUN 14 12  CREATININE 1.08 0.94  CALCIUM 9.4 8.7*   LFT  Recent Labs  01/22/17 1637  PROT 7.7  ALBUMIN 4.3  AST 19  ALT 18  ALKPHOS 63  BILITOT 1.0   PT/INR No results for input(s): LABPROT, INR in the last 72 hours.   Studies/Results: No results found.  Impression/Plan: 56 yo with profuse painless rectal bleeding likely due to a diverticular bleed. Will prep for colonoscopy to be done tomorrow. Clear liquid diet. NPO p MN. Colon prep this afternoon. Colonoscopy tomorrow with Propofol sedation.    LOS: 1 day   [place] C.  01/23/2017, 12:02 PM  Pager [phone]  AFTER 5 pm or on weekends please call [phone]

## 2017-01-24 NOTE — Transfer of Care (Signed)
Immediate Anesthesia Transfer of Care Note  Patient: Joseph [name]  Procedure(s) Performed: Procedure(s): COLONOSCOPY WITH PROPOFOL (N/A)  Patient Location: PACU and Endoscopy Unit  Anesthesia Type:MAC  Level of Consciousness: awake, alert , oriented and patient cooperative  Airway & Oxygen Therapy: Patient Spontanous Breathing and Patient connected to face mask oxygen  Post-op Assessment: Report given to RN, Post -op Vital signs reviewed and stable and Patient moving all extremities  Post vital signs: Reviewed and stable  Last Vitals:  Vitals:   01/24/17 1052 01/24/17 1132  BP: 111/69 122/69  Pulse: 68 68  Resp: 18   Temp: 37 C 37 C    Last Pain:  Vitals:   01/24/17 1052  TempSrc: Oral  PainSc:          Complications: No apparent anesthesia complications

## 2017-01-24 NOTE — Interval H&P Note (Signed)
History and Physical Interval Note:  01/24/2017 12:11 PM  Joseph [name]  has presented today for surgery, with the diagnosis of GI Bleed  The various methods of treatment have been discussed with the patient and family. After consideration of risks, benefits and other options for treatment, the patient has consented to  Procedure(s): COLONOSCOPY WITH PROPOFOL (N/A) as a surgical intervention .  The patient's history has been reviewed, patient examined, no change in status, stable for surgery.  I have reviewed the patient's chart and labs.  Questions were answered to the patient's satisfaction.     [place] C.

## 2017-01-25 ENCOUNTER — Encounter (HOSPITAL_COMMUNITY): Payer: Self-pay | Admitting: Gastroenterology

## 2017-01-25 DIAGNOSIS — D369 Benign neoplasm, unspecified site: Secondary | ICD-10-CM

## 2017-01-25 DIAGNOSIS — K639 Disease of intestine, unspecified: Secondary | ICD-10-CM

## 2017-01-25 LAB — PREPARE RBC (CROSSMATCH)

## 2017-01-25 LAB — CBC
HCT: 22.2 % — ABNORMAL LOW (ref 39.0–52.0)
HEMOGLOBIN: 7.1 g/dL — AB (ref 13.0–17.0)
MCH: 28.6 pg (ref 26.0–34.0)
MCHC: 32 g/dL (ref 30.0–36.0)
MCV: 89.5 fL (ref 78.0–100.0)
Platelets: 169 10*3/uL (ref 150–400)
RBC: 2.48 MIL/uL — ABNORMAL LOW (ref 4.22–5.81)
RDW: 13.7 % (ref 11.5–15.5)
WBC: 6.9 10*3/uL (ref 4.0–10.5)

## 2017-01-25 LAB — BASIC METABOLIC PANEL
Anion gap: 5 (ref 5–15)
BUN: 10 mg/dL (ref 6–20)
CALCIUM: 8.5 mg/dL — AB (ref 8.9–10.3)
CHLORIDE: 107 mmol/L (ref 101–111)
CO2: 28 mmol/L (ref 22–32)
CREATININE: 1.19 mg/dL (ref 0.61–1.24)
GFR calc Af Amer: >60 mL/min (ref >60)
GFR calc non Af Amer: >60 mL/min (ref >60)
Glucose, Bld: 106 mg/dL — ABNORMAL HIGH (ref 65–99)
Potassium: 3.3 mmol/L — ABNORMAL LOW (ref 3.5–5.1)
SODIUM: 140 mmol/L (ref 135–145)

## 2017-01-25 MED ORDER — SODIUM CHLORIDE 0.9 % IV SOLN
Freq: Once | INTRAVENOUS | Status: AC
  Administered 2017-01-25: 16:00:00 via INTRAVENOUS

## 2017-01-25 NOTE — Progress Notes (Signed)
[facility] Gastroenterology Progress Note  Joseph [name] 56 y.o. 1961-03-10   Subjective: Rectal bleeding today. Denies stool. Denies abdominal pain.  Objective: Vital signs in last 24 hours: Vitals:   01/25/17 1207 01/25/17 1439  BP: 114/68 126/62  Pulse: 73 66  Resp: 16 16  Temp: 97.9 F (36.6 C) 98.9 F (37.2 C)    Physical Exam: Gen: alert, no acute distress HEENT: anicteric sclera CV: RRR Chest: CTA B Abd: soft, nontender, nondistended, +BS  Lab Results:  Recent Labs  01/23/17 0500 01/25/17 0457  NA 140 140  K 3.2* 3.3*  CL 106 107  CO2 27 28  GLUCOSE 110* 106*  BUN 12 10  CREATININE 0.94 1.19  CALCIUM 8.7* 8.5*    Recent Labs  01/22/17 1637  AST 19  ALT 18  ALKPHOS 63  BILITOT 1.0  PROT 7.7  ALBUMIN 4.3    Recent Labs  01/24/17 0523 01/25/17 0457  WBC 6.6 6.9  HGB 8.2* 7.1*  HCT 25.2* 22.2*  MCV 91.0 89.5  PLT 194 169   No results for input(s): LABPROT, INR in the last 72 hours.    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 Bleeding likely from anorectal polyp that was an adenoma as well on biopsy. Needs outpt surgery vs EMR at a tertiary facilty and will need to remain off Plavix if possible until then. Advance diet. D/W Dr. Bonner Puna who will consult surgery while he is here to help with timing of surgery vs referral. Will follow.   [place] C. 01/25/2017, 4:07 PM   Pager [phone]  AFTER 5 pm or on weekends call [phone] ID: Joseph [name], male   DOB: [birth year], 56 y.o.   MRN: [id number]

## 2017-01-25 NOTE — Progress Notes (Signed)
PROGRESS NOTE  Joseph [name]  EYC:[id number] DOB: 1960-11-02 DOA: 01/22/2017 PCP: No PCP Per Patient   Brief Narrative: Joseph [name] is a 56 y.o. male with a history of HTN, HLD, and claudication/PVD on plavix presenting with lower GI bleeding. He reported at least 10 episodes of dark red blood in liquid stool in the previous 12 hours without abdominal pain, vomiting. He reported some dizziness without syncope. Vitals were stable. Hgb was 10.7 from prior of 13. He was admitted and GI was consulted. He underwent colonoscopy 4/18 showing multiple polyps, suspected bleeding source being an anorectal polyp found to be adenoma. Also noted was cecal mass which was found to be adenoma with focal high-grade dysplasia. He has required a transfusion 4/19 due to ongoing blood loss.   Assessment & Plan: Principal Problem:   GI bleed Active Problems:   Accelerated hypertension   Mild hyperlipidemia   Claudication of left lower extremity (HCC)  Lower GI bleed: Suspected source is anorectal polyp found to be adenoma. - Still bleeding. Hgb 8.2 > 7.1, so will transfuse 4/19. Given h/o intermediate exercise stress test, could consider threshold of 9.  - Diet per GI - Holding ASA/plavix  Cecal adenoma with focal high-grade dysplasia: On biopsy of mass from colonoscopy 4/18. - Will require surgery, though not obstructing so not emergent. GI also considering EMR at tertiary care facility. Will hold ASA/plavix.  - General surgery consulted  HTN: Cardiologist is Dr. Einar Gip.  - Continue norvasc, hold HTCZ/lisinopril given bleeding. Normotensive.   HLD -Continue Crestor  PVD:  - As above, plan to hold ASA and plavix in preparation for surgical intervention. - Patient has stopped smoking  Hyperglycemia: Mild.  - Monitor with AM lab draw  DVT prophylaxis: SCDs Code Status: Full Family Communication: Wife at bedside Disposition Plan: [place] home once stable.   Consultants:     GI, Dr. Michail Sermon  Procedures:  Colonoscopy 01/24/2017 by Dr. Michail Sermon: - One polyp in the sigmoid colon at 20 cm proximal to the anus. Biopsied. Tattooed. - One 30 mm polyp in the sigmoid colon. Biopsied. - Rule out malignancy, tumor in the cecum. Biopsied. - One 20 mm polyp in the rectum. Biopsied. - One 10 mm polyp at the recto-sigmoid colon. - Internal hemorrhoids. - The examined portion of the ileum was normal. Biopsied.  Antimicrobials:  None   Subjective: Had some rectal bleeding x2 today. No brown stool. Tolerating food without abd pain. No N/V. No light-headedness, dizziness, chest pain, dyspnea, palpitations.   Objective: Vitals:   01/25/17 1137 01/25/17 1207 01/25/17 1439 01/25/17 1610  BP: 139/73 114/68 126/62 131/72  Pulse: 72 73 66 71  Resp: 16 16 16 16   Temp: 98.9 F (37.2 C) 97.9 F (36.6 C) 98.9 F (37.2 C) 98.6 F (37 C)  TempSrc: Oral Oral Oral Oral  SpO2: 100% 100% 100% 100%  Weight:      Height:        Intake/Output Summary (Last 24 hours) at 01/25/17 1615 Last data filed at 01/25/17 1430  Gross per 24 hour  Intake           3097.5 ml  Output              500 ml  Net           2597.5 ml   Filed Weights   01/22/17 2224  Weight: 103 kg (227 lb)    Examination: General exam: 56 y.o. male in no distress standing at bedside  Respiratory system: Non-labored breathing room air. Clear to auscultation bilaterally.  Cardiovascular system: Regular rate and rhythm. No murmur, rub, or gallop. No JVD, and no pedal edema. Gastrointestinal system: Abdomen soft, non-tender, non-distended, with normoactive bowel sounds. No organomegaly or masses felt. Central nervous system: Alert and oriented. No focal neurological deficits. Extremities: Warm, no deformities Skin: No rashes, lesions no ulcers Psychiatry: Judgement and insight appear normal. Mood & affect appropriate.   Data Reviewed: I have personally reviewed following labs and imaging  studies  CBC:  Recent Labs Lab 01/23/17 0500 01/23/17 1251 01/23/17 2009 01/24/17 0523 01/25/17 0457  WBC 4.9 5.4 6.0 6.6 6.9  HGB 9.3* 9.3* 9.0* 8.2* 7.1*  HCT 28.8* 29.1* 27.9* 25.2* 22.2*  MCV 90.6 90.9 91.2 91.0 89.5  PLT 179 195 188 194 585   Basic Metabolic Panel:  Recent Labs Lab 01/22/17 1637 01/23/17 0500 01/25/17 0457  NA 141 140 140  K 3.5 3.2* 3.3*  CL 105 106 107  CO2 28 27 28   GLUCOSE 113* 110* 106*  BUN 14 12 10   CREATININE 1.08 0.94 1.19  CALCIUM 9.4 8.7* 8.5*   GFR: Estimated Creatinine Clearance: 86.7 mL/min (by C-G formula based on SCr of 1.19 mg/dL). Liver Function Tests:  Recent Labs Lab 01/22/17 1637  AST 19  ALT 18  ALKPHOS 63  BILITOT 1.0  PROT 7.7  ALBUMIN 4.3   Radiology Studies: No results found.  Scheduled Meds: . amLODipine  10 mg Oral Daily  . aspirin  81 mg Oral Daily  . lisinopril  20 mg Oral Daily   And  . hydrochlorothiazide  12.5 mg Oral Daily  . rosuvastatin  20 mg Oral q1800   Continuous Infusions: . sodium chloride 20 mL/hr (01/24/17 2322)  . lactated ringers 125 mL/hr (01/24/17 2242)     LOS: 3 days   Time spent: 25 minutes.  Vance Gather, MD [facility] Pager [phone]  If 7PM-7AM, please contact night-coverage [email or web address] Password [email or web address] 01/25/2017, 4:15 PM

## 2017-01-25 NOTE — Consult Note (Signed)
Reason for Consult: Joseph [name] is an 56 y.o. [name].  HPI: A she is a 56 year old [name] who presented to the emergency room on 01/22/17 with 8-9 loose bloody bowel movements. He is on Plavix, with a history of atypical chest pain.   Workup in the ED showed he was hypertensive on admission this improved. He was afebrile and vital signs were stable. CMP was essentially normal glucose was 113. CBC shows a normal white count of 6.6. Hemoglobin 10.3 hematocrit 32.4. Since admission his hemoglobin has gone as low as 7.1/hematocrit 22.2. Fecal occult was negative on admission. He has a CT scan from 05/26/16 which showed no significant abnormalities. A tiny umbilical hernia with fat mall right inguinal hernia containing fat.   He was admitted by the medicine [name], and then seen in consultation by Joseph [name]. It was Joseph [name] opinion patient had profuse painless rectal bleeding likely due to a diverticular bleed he was prepped for colonoscopy. This was performed on 01/24/17.  He was found to have 1 polyp in the sigmoid colon 20 cm proximal to the anus which was biopsied and tattooed. One 30 mm polyp in the sigmoid.   A frond-like/villous and polypoid non-obstructing large mass was found       in the cecum. The mass was non-circumferential. No bleeding was present.   Biopsies were taken with a cold forceps for histology.   Polyps were also found in the rectum and the rectosigmoid colon were also internal hemorrhoids. The ileum was normal. He is being transfused today and we are asked to see in consultation for possible surgical resection.  Past Medical History:  Diagnosis Date  . Asthma    child  . DVT (deep venous thrombosis) ([place])   . Hyperlipidemia   . Hypertension   . Inguinal hernia    LIH  . Pilonidal cyst   . Rectal bleeding   . Tuberculosis    tb  dx 3-4    Past Surgical History:  Procedure  Laterality Date  . COLONOSCOPY WITH PROPOFOL N/A 01/24/2017   Procedure: COLONOSCOPY WITH PROPOFOL;  Surgeon: Joseph Corner, MD;  Location: [facility];  [name]: Endoscopy;  Laterality: N/A;  . INGUINAL HERNIA REPAIR  09/07/2011   Procedure: HERNIA REPAIR INGUINAL ADULT;  Surgeon: Rolm Bookbinder, MD;  Location: [facility];  [name]: General;  Laterality: Left;  . PERIPHERAL VASCULAR CATHETERIZATION N/A 07/25/2016   Procedure: Lower Extremity Angiography;  Surgeon: Adrian Prows, MD;  Location: [place] [facility];  [name]: Cardiovascular;  Laterality: N/A;  . PILONIDAL CYST EXCISION      History reviewed. No pertinent family history.  Social History:  reports that he quit smoking about 8 months ago. He has a 7.50 pack-year smoking history. He has never used smokeless tobacco. He reports that he drinks alcohol. He reports that he does not use drugs.  Allergies: No Known Allergies  Prior to Admission medications   Medication Sig Start Date End Date Taking? Authorizing Provider  amLODipine (NORVASC) 10 MG tablet Take 10 mg by mouth daily. 06/21/16  Yes Historical Provider, MD  aspirin 81 MG chewable tablet Chew 81 mg by mouth daily.   Yes Historical Provider, MD  clopidogrel (PLAVIX) 75 MG tablet Take 1 tablet (75 mg total) by mouth daily. 07/25/16  Yes Adrian Prows, MD  ibuprofen (ADVIL,MOTRIN) 200 MG tablet Take 200 mg by mouth every 6 (six) hours as needed (for pain.).   Yes  Historical Provider, MD  lisinopril-hydrochlorothiazide (PRINZIDE,ZESTORETIC) 20-12.5 MG tablet TAKE 1 TABLET BY MOUTH DAILY. 07/18/16  Yes Stephanie D English, PA  rosuvastatin (CRESTOR) 20 MG tablet Take 20 mg by mouth daily. 01/07/17  Yes Historical Provider, MD     Results for orders placed or performed during the hospital encounter of 01/22/17 (from the past 48 hour(s))  CBC     Status: Abnormal   Collection Time: 01/23/17  8:09 PM  Result Value Ref Range   WBC 6.0 4.0 - 10.5 K/uL   RBC 3.06 (L) 4.22 - 5.81 MIL/uL    Hemoglobin 9.0 (L) 13.0 - 17.0 g/dL   HCT 27.9 (L) 39.0 - 52.0 %   MCV 91.2 78.0 - 100.0 fL   MCH 29.4 26.0 - 34.0 pg   MCHC 32.3 30.0 - 36.0 g/dL   RDW 13.6 11.5 - 15.5 %   Platelets 188 150 - 400 K/uL  CBC     Status: Abnormal   Collection Time: 01/24/17  5:23 AM  Result Value Ref Range   WBC 6.6 4.0 - 10.5 K/uL   RBC 2.77 (L) 4.22 - 5.81 MIL/uL   Hemoglobin 8.2 (L) 13.0 - 17.0 g/dL   HCT 25.2 (L) 39.0 - 52.0 %   MCV 91.0 78.0 - 100.0 fL   MCH 29.6 26.0 - 34.0 pg   MCHC 32.5 30.0 - 36.0 g/dL   RDW 13.8 11.5 - 15.5 %   Platelets 194 150 - 400 K/uL  CBC     Status: Abnormal   Collection Time: 01/25/17  4:57 AM  Result Value Ref Range   WBC 6.9 4.0 - 10.5 K/uL   RBC 2.48 (L) 4.22 - 5.81 MIL/uL   Hemoglobin 7.1 (L) 13.0 - 17.0 g/dL   HCT 22.2 (L) 39.0 - 52.0 %   MCV 89.5 78.0 - 100.0 fL   MCH 28.6 26.0 - 34.0 pg   MCHC 32.0 30.0 - 36.0 g/dL   RDW 13.7 11.5 - 15.5 %   Platelets 169 150 - 400 K/uL  Basic metabolic panel     Status: Abnormal   Collection Time: 01/25/17  4:57 AM  Result Value Ref Range   Sodium 140 135 - 145 mmol/L   Potassium 3.3 (L) 3.5 - 5.1 mmol/L   Chloride 107 101 - 111 mmol/L   CO2 28 22 - 32 mmol/L   Glucose, Bld 106 (H) 65 - 99 mg/dL   BUN 10 6 - 20 mg/dL   Creatinine, Ser 1.19 0.61 - 1.24 mg/dL   Calcium 8.5 (L) 8.9 - 10.3 mg/dL   GFR calc non Af Amer >60 >60 mL/min   GFR calc Af Amer >60 >60 mL/min    Comment: (NOTE) The eGFR has been calculated using the CKD EPI equation. This calculation has not been validated in all clinical situations. eGFR's persistently <60 mL/min signify possible Chronic Kidney Disease.    Anion gap 5 5 - 15  Prepare RBC     Status: None   Collection Time: 01/25/17  8:30 AM  Result Value Ref Range   Order Confirmation ORDER PROCESSED BY BLOOD BANK     No results found.  Review of Systems  Constitutional: Negative.   HENT: Negative.   Eyes: Negative.   Respiratory: Negative.   Cardiovascular: Negative.    Gastrointestinal: Positive for blood in stool, diarrhea and melena.  Genitourinary: Negative.   Musculoskeletal: Negative.   Skin: Negative.   Neurological: Negative.   Endo/Heme/Allergies: Negative.   Psychiatric/Behavioral: Negative.  Blood pressure 126/62, pulse 66, temperature 98.9 F (37.2 C), temperature source Oral, resp. rate 16, height '5\' 11"'$  (1.803 m), weight 103 kg (227 lb), SpO2 100 %. Physical Exam  Constitutional: He is oriented to person, place, and time. He appears well-developed and well-nourished. No distress.  HENT:  Head: Normocephalic and atraumatic.  Mouth/Throat: No oropharyngeal exudate.  Eyes: Right eye exhibits no discharge. Left eye exhibits no discharge. No scleral icterus.  Pupils are equal  Neck: Normal range of motion. Neck supple. No JVD present. No tracheal deviation present. No thyromegaly present.  Cardiovascular: Normal rate, regular rhythm, normal heart sounds and intact distal pulses.   No murmur heard. Respiratory: Effort normal and breath sounds normal. No respiratory distress. He has no wheezes. He has no rales. He exhibits no tenderness.  GI: Soft. Bowel sounds are normal. He exhibits no distension and no mass. There is no tenderness. There is no rebound and no guarding.  He vomited with Bowel prep, and has   Musculoskeletal: He exhibits no edema or tenderness.  Lymphadenopathy:    He has no cervical adenopathy.  Neurological: He is alert and oriented to person, place, and time. No cranial nerve deficit.  Skin: Skin is warm and dry. No rash noted. He is not diaphoretic. No erythema. No pallor.  Psychiatric: He has a normal mood and affect. His behavior is normal. Judgment and thought content normal.    Assessment/Plan: Lower GI bleed with Joseph mass and multiple polyps Anemia secondary to GI bleed Hx of DVT - On Plavix - last dose 01/21/17 Hypertension Hyperlipidemia Hx of atypical chest Pain - Dr. Einar Gip [facility] repair 08/2011  Plan:   Await biopsy results, ask dr. Einar Gip to clear.  Will make recommendations after biopsies are back and we have path reports.   Seher Schlagel 01/25/2017, 4:09 PM

## 2017-01-26 DIAGNOSIS — D126 Benign neoplasm of colon, unspecified: Secondary | ICD-10-CM | POA: Diagnosis present

## 2017-01-26 DIAGNOSIS — R9439 Abnormal result of other cardiovascular function study: Secondary | ICD-10-CM

## 2017-01-26 DIAGNOSIS — D62 Acute posthemorrhagic anemia: Secondary | ICD-10-CM | POA: Diagnosis present

## 2017-01-26 DIAGNOSIS — D120 Benign neoplasm of cecum: Secondary | ICD-10-CM

## 2017-01-26 DIAGNOSIS — K635 Polyp of colon: Secondary | ICD-10-CM | POA: Diagnosis present

## 2017-01-26 LAB — BPAM RBC
BLOOD PRODUCT EXPIRATION DATE: 201805022359
Blood Product Expiration Date: 201805022359
ISSUE DATE / TIME: 201804191606
ISSUE DATE / TIME: 201804191132
UNIT TYPE AND RH: 6200
Unit Type and Rh: 6200

## 2017-01-26 LAB — TYPE AND SCREEN
ABO/RH(D): A POS
ANTIBODY SCREEN: NEGATIVE
UNIT DIVISION: 0
Unit division: 0

## 2017-01-26 LAB — CBC
HEMATOCRIT: 28.1 % — AB (ref 39.0–52.0)
HEMOGLOBIN: 9 g/dL — AB (ref 13.0–17.0)
MCH: 28.8 pg (ref 26.0–34.0)
MCHC: 32 g/dL (ref 30.0–36.0)
MCV: 90.1 fL (ref 78.0–100.0)
Platelets: 196 10*3/uL (ref 150–400)
RBC: 3.12 MIL/uL — ABNORMAL LOW (ref 4.22–5.81)
RDW: 14.6 % (ref 11.5–15.5)
WBC: 6.7 10*3/uL (ref 4.0–10.5)

## 2017-01-26 MED ORDER — CEFOTETAN DISODIUM-DEXTROSE 2-2.08 GM-% IV SOLR: 2.0000 g | INTRAVENOUS | Status: DC

## 2017-01-26 MED ORDER — METRONIDAZOLE 500 MG PO TABS
1000.0000 mg | ORAL_TABLET | ORAL | Status: AC
  Administered 2017-01-28 (×3): 1000 mg via ORAL
  Filled 2017-01-26 (×3): qty 2

## 2017-01-26 MED ORDER — DEXTROSE 5 % IV SOLN: 2.0000 g | INTRAVENOUS | Status: DC

## 2017-01-26 MED ORDER — CHLORHEXIDINE GLUCONATE 4 % EX LIQD
60.0000 mL | Freq: Once | CUTANEOUS | Status: AC
  Administered 2017-01-29: 4 via TOPICAL
  Filled 2017-01-26: qty 60

## 2017-01-26 MED ORDER — BISACODYL 5 MG PO TBEC
20.0000 mg | DELAYED_RELEASE_TABLET | Freq: Once | ORAL | Status: AC
  Administered 2017-01-28: 20 mg via ORAL
  Filled 2017-01-26: qty 4

## 2017-01-26 MED ORDER — METOPROLOL TARTRATE 25 MG PO TABS
25.0000 mg | ORAL_TABLET | Freq: Two times a day (BID) | ORAL | Status: DC
  Administered 2017-01-26 (×2): 25 mg via ORAL
  Filled 2017-01-26 (×3): qty 1

## 2017-01-26 MED ORDER — ALVIMOPAN 12 MG PO CAPS: 12.0000 mg | ORAL_CAPSULE | ORAL | Status: DC

## 2017-01-26 MED ORDER — ENOXAPARIN SODIUM 40 MG/0.4ML ~~LOC~~ SOLN: 40.0000 mg | SUBCUTANEOUS | Status: DC

## 2017-01-26 MED ORDER — CHLORHEXIDINE GLUCONATE 4 % EX LIQD
60.0000 mL | Freq: Once | CUTANEOUS | Status: AC
  Administered 2017-01-28: 4 via TOPICAL
  Filled 2017-01-26: qty 60

## 2017-01-26 MED ORDER — ACETAMINOPHEN 500 MG PO TABS: 1000.0000 mg | ORAL_TABLET | ORAL | Status: DC

## 2017-01-26 MED ORDER — NEOMYCIN SULFATE 500 MG PO TABS
1000.0000 mg | ORAL_TABLET | ORAL | Status: AC
  Administered 2017-01-28 (×3): 1000 mg via ORAL
  Filled 2017-01-26 (×4): qty 2

## 2017-01-26 MED ORDER — GABAPENTIN 300 MG PO CAPS: 300.0000 mg | ORAL_CAPSULE | ORAL | Status: DC

## 2017-01-26 NOTE — Progress Notes (Signed)
2 Days Post-Op  Subjective: No issues this AM  Awaiting plans from Dr. Einar Gip and Dr. Johney Maine.    Objective: Vital signs in last 24 hours: Temp:  [97.9 F (36.6 C)-99.1 F (37.3 C)] 97.9 F (36.6 C) (04/20 0443) Pulse Rate:  [63-84] 63 (04/20 0443) Resp:  [16-19] 16 (04/20 0443) BP: (113-148)/(62-89) 148/89 (04/20 0443) SpO2:  [99 %-100 %] 99 % (04/20 0443) Last BM Date: 01/25/17 (bloody) 120 PO 3600 IV Voided x 3  Afebrile, VSS H/H up to 9/28    Intake/Output from previous day: 04/19 0701 - 04/20 0700 In: 3826.8 [P.O.:120; I.V.:2781.8; Blood:925] Out: -  Intake/Output this shift: No intake/output data recorded.  General appearance: alert, cooperative and no distress GI: soft a little sore on the right side.  Tolerating diet.  Lab Results:   Recent Labs  01/25/17 0457 01/26/17 0433  WBC 6.9 6.7  HGB 7.1* 9.0*  HCT 22.2* 28.1*  PLT 169 196    BMET  Recent Labs  01/25/17 0457  NA 140  K 3.3*  CL 107  CO2 28  GLUCOSE 106*  BUN 10  CREATININE 1.19  CALCIUM 8.5*   PT/INR No results for input(s): LABPROT, INR in the last 72 hours.   Recent Labs Lab 01/22/17 1637  AST 19  ALT 18  ALKPHOS 63  BILITOT 1.0  PROT 7.7  ALBUMIN 4.3     Lipase  No results found for: LIPASE   Studies/Results: No results found.  Medications: . amLODipine  10 mg Oral Daily  . lisinopril  20 mg Oral Daily   And  . hydrochlorothiazide  12.5 mg Oral Daily  . rosuvastatin  20 mg Oral q1800   . sodium chloride 20 mL/hr (01/24/17 2322)  . lactated ringers 125 mL/hr (01/24/17 2242)    Assessment/Plan Lower GI bleed with cecal mass and multiple polyps Anemia secondary to GI bleed Hx of DVT - On Plavix - last dose 01/21/17 Hypertension Hyperlipidemia Hx of atypical chest Pain - Dr. Einar Gip [facility] repair 08/2011 FEN:  IV fluids/Regular diet ID:  None DVT:  SCD added  Plan:  Review with Dr. Johney Maine, Cardiology clearance Dr. Einar Gip.   Diagnosis 01/24/17 1. 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 - NO INVASIVE CARCINOMA. 2. Terminal ileum, biopsy - BENIGN ILEAL MUCOSA WITH BENIGN LYMPHOID AGGREGATES. - NO ACTIVE INFLAMMATION, GRANULOMAS OR MALIGNANCY. 3. Colon, polyp(s), sigmoid - FRAGMENTS OF TUBULAR ADENOMA. - NO HIGH GRADE DYSPLASIA OR MALIGNANCY IDENTIFIED. 4. Rectum, polyp(s) - TWO FRAGMENTS OF TUBULAR ADENOMA. - NO HIGH GRADE DYSPLASIA OR MALIGNANCY IDENTIFIED.       LOS: 4 days    Joseph [name] 01/26/2017 [phone]

## 2017-01-26 NOTE — Progress Notes (Signed)
[facility] Progress Note  Joseph [name] 56 y.o. Jul 10, 1961   Subjective: No bleeding overnight. No BMs overnight. Feels good. Denies abdominal pain. Wife at bedside.  Objective: Vital signs in last 24 hours: Vitals:   01/26/17 1054 01/26/17 1055  BP: 137/81 137/81  Pulse:  61  Resp:    Temp:    T 97.9  Physical Exam: Gen: alert, no acute distress CV: RRR Chest: CTA B Abd: soft, nontender, nondistended, +BS  Lab Results:  Recent Labs  01/25/17 0457  NA 140  K 3.3*  CL 107  CO2 28  GLUCOSE 106*  BUN 10  CREATININE 1.19  CALCIUM 8.5*   No results for input(s): AST, ALT, ALKPHOS, BILITOT, PROT, ALBUMIN in the last 72 hours.  Recent Labs  01/25/17 0457 01/26/17 0433  WBC 6.9 6.7  HGB 7.1* 9.0*  HCT 22.2* 28.1*  MCV 89.5 90.1  PLT 169 196   No results for input(s): LABPROT, INR in the last 72 hours.    Assessment/Plan: Recent rectal bleeding from rectal polyp that has resolved. Hgb 9 (s/p 2 U PRBCs yesterday). Cecal lesion (TA with focal HGD); Large sigmoid polyps (TA on bx). Agree with surgery as next step. Remain off Plavix until surgery. Timing of inpt vs outpt per [facility]. Will sign off.  Dr. Cristina Gong available to see this weekend if needed.   [place]. 01/26/2017, 12:16 PM   Pager [phone]  AFTER 5 pm or on weekends call [phone] ID: Joseph [name], male   DOB: January 01, 1961, 56 y.o.   MRN: [id number]

## 2017-01-26 NOTE — Progress Notes (Addendum)
PROGRESS NOTE  Joseph [name]  NAT:[id number] DOB: 1961/08/26 DOA: 01/22/2017 PCP: No PCP Per Patient   Brief Narrative: Joseph [name] is a 56 y.o. male with a history of HTN, HLD, and claudication/PVD on plavix presenting with lower GI bleeding. He reported at least 10 episodes of dark red blood in liquid stool in the previous 12 hours without abdominal pain, vomiting. He reported some dizziness without syncope. Vitals were stable. Hgb was 10.7 from prior of 13. He was admitted and GI was consulted. He underwent colonoscopy 4/18 showing multiple polyps, suspected bleeding source being an anorectal polyp found to be adenoma. Also noted was cecal mass which was found to be adenoma with focal high-grade dysplasia. He has required a transfusion 4/19 due to ongoing blood loss. Plan is for colectomy, timing of which is per surgery.   Assessment & Plan: Principal Problem:   GI bleed Active Problems:   Accelerated hypertension   Mild hyperlipidemia   Claudication of left lower extremity (HCC)  Acute blood loss anemia due to lower GI bleed: Suspected source is anorectal polyp found to be adenoma. - Last bleeding 4/19. Hgb 8.2 > 7.1 > 2u PRBCs > 9.  - Continue monitoring CBC daily and clinically.   History of intermediate exercise stress test: No current chest pain. Has very good functional status.  - Will check ECG. Discussed with cardiologist, Dr. Einar Gip, who recommends proceeding with surgery without further work up.  - Will add beta-blocker, hold ACE/HCTZ - Ok to continue ASA per surgery, and ok to hold plavix per cardiology.  Cecal adenoma with focal high-grade dysplasia: On biopsy of mass from colonoscopy 4/18. - Will require surgery, timing per surgery.    HTN: Cardiologist is Dr. Einar Gip. Normotensive.  - Continue norvasc, adding low dose metoprolol, holding HTCZ/lisinopril.    HLD -Continue crestor  PVD:  - As above, plan to hold plavix in preparation for surgical  intervention. - Continue ASA, statin. - Patient has stopped smoking  Hyperglycemia: Mild fasting hyperglycemia.  - Check HbA1c  DVT prophylaxis: SCDs Code Status: Full Family Communication: Wife at bedside Disposition Plan: [place] home once stable.  Consultants:   GI, Dr. Michail Sermon  General surgery, Dr. Johney Maine  Cardiology, Dr. Einar Gip by phone.  Procedures:  Colonoscopy 01/24/2017 by Dr. Michail Sermon: - One polyp in the sigmoid colon at 20 cm proximal to the anus. Biopsied. Tattooed. - One 30 mm polyp in the sigmoid colon. Biopsied. - Rule out malignancy, tumor in the cecum. Biopsied. - One 20 mm polyp in the rectum. Biopsied. - One 10 mm polyp at the recto-sigmoid colon. - Internal hemorrhoids. - The examined portion of the ileum was normal. Biopsied.  Antimicrobials:  None   Subjective: Last rectal bleeding < 24 hours ago, but none overnight. Feels better. No stool since procedure. No N/V, chest pain or dyspnea.   Objective: Vitals:   01/25/17 1645 01/25/17 1905 01/25/17 2055 01/26/17 0443  BP: 129/73 (!) 143/74 134/86 (!) 148/89  Pulse: 74 67 75 63  Resp: 19 16 16 16   Temp: 98.8 F (37.1 C) 99.1 F (37.3 C) 98.4 F (36.9 C) 97.9 F (36.6 C)  TempSrc: Oral Oral Oral Oral  SpO2: 99% 100% 100% 99%  Weight:      Height:        Intake/Output Summary (Last 24 hours) at 01/26/17 0900 Last data filed at 01/26/17 0200  Gross per 24 hour  Intake          2502.67 ml  Output                0 ml  Net          2502.67 ml   Filed Weights   01/22/17 2224  Weight: 103 kg (227 lb)    Examination: General exam: 56 y.o. male in no distress laying in bed Respiratory system: Non-labored breathing room air. Clear to auscultation bilaterally.  Cardiovascular system: Regular rate and rhythm. No murmur, rub, or gallop. No JVD, and no pedal edema. Gastrointestinal system: Abdomen soft, non-tender, non-distended, with normoactive bowel sounds. No organomegaly or masses  felt. Central nervous system: Alert and oriented. No focal neurological deficits. Extremities: Warm, no deformities Skin: No rashes, lesions no ulcers Psychiatry: Judgement and insight appear normal. Mood & affect appropriate.   Data Reviewed: I have personally reviewed following labs and imaging studies  CBC:  Recent Labs Lab 01/23/17 1251 01/23/17 2009 01/24/17 0523 01/25/17 0457 01/26/17 0433  WBC 5.4 6.0 6.6 6.9 6.7  HGB 9.3* 9.0* 8.2* 7.1* 9.0*  HCT 29.1* 27.9* 25.2* 22.2* 28.1*  MCV 90.9 91.2 91.0 89.5 90.1  PLT 195 188 194 169 478   Basic Metabolic Panel:  Recent Labs Lab 01/22/17 1637 01/23/17 0500 01/25/17 0457  NA 141 140 140  K 3.5 3.2* 3.3*  CL 105 106 107  CO2 28 27 28   GLUCOSE 113* 110* 106*  BUN 14 12 10   CREATININE 1.08 0.94 1.19  CALCIUM 9.4 8.7* 8.5*   GFR: Estimated Creatinine Clearance: 86.7 mL/min (by C-G formula based on SCr of 1.19 mg/dL). Liver Function Tests:  Recent Labs Lab 01/22/17 1637  AST 19  ALT 18  ALKPHOS 63  BILITOT 1.0  PROT 7.7  ALBUMIN 4.3   Radiology Studies: No results found.  Scheduled Meds: . amLODipine  10 mg Oral Daily  . lisinopril  20 mg Oral Daily   And  . hydrochlorothiazide  12.5 mg Oral Daily  . rosuvastatin  20 mg Oral q1800   Continuous Infusions: . sodium chloride 20 mL/hr (01/24/17 2322)  . lactated ringers 125 mL/hr (01/24/17 2242)     LOS: 4 days   Time spent: 15 minutes.  Vance Gather, MD [facility] Pager [phone]  If 7PM-7AM, please contact night-coverage [email or web address] Password [email or web address] 01/26/2017, 9:00 AM

## 2017-01-27 DIAGNOSIS — D126 Benign neoplasm of colon, unspecified: Secondary | ICD-10-CM

## 2017-01-27 LAB — BASIC METABOLIC PANEL
ANION GAP: 7 (ref 5–15)
BUN: 14 mg/dL (ref 6–20)
CALCIUM: 8.9 mg/dL (ref 8.9–10.3)
CHLORIDE: 104 mmol/L (ref 101–111)
CO2: 29 mmol/L (ref 22–32)
CREATININE: 1.14 mg/dL (ref 0.61–1.24)
GFR calc non Af Amer: >60 mL/min (ref >60)
GLUCOSE: 101 mg/dL — AB (ref 65–99)
Potassium: 3.6 mmol/L (ref 3.5–5.1)
Sodium: 140 mmol/L (ref 135–145)

## 2017-01-27 LAB — CBC
HEMATOCRIT: 28.8 % — AB (ref 39.0–52.0)
HEMOGLOBIN: 9.4 g/dL — AB (ref 13.0–17.0)
MCH: 29.5 pg (ref 26.0–34.0)
MCHC: 32.6 g/dL (ref 30.0–36.0)
MCV: 90.3 fL (ref 78.0–100.0)
Platelets: 229 10*3/uL (ref 150–400)
RBC: 3.19 MIL/uL — ABNORMAL LOW (ref 4.22–5.81)
RDW: 14.3 % (ref 11.5–15.5)
WBC: 7.3 10*3/uL (ref 4.0–10.5)

## 2017-01-27 LAB — HEMOGLOBIN A1C
Hgb A1c MFr Bld: 5 % (ref 4.8–5.6)
MEAN PLASMA GLUCOSE: 97 mg/dL

## 2017-01-27 MED ORDER — METOPROLOL TARTRATE 12.5 MG HALF TABLET
12.5000 mg | ORAL_TABLET | Freq: Two times a day (BID) | ORAL | Status: DC
  Administered 2017-01-27 – 2017-01-29 (×4): 12.5 mg via ORAL
  Filled 2017-01-27 (×4): qty 1

## 2017-01-27 NOTE — Progress Notes (Signed)
Assessment Multiple colon polyps and LGI bleeding s/p colonoscopy and biopsies-all polyps are  Tubular adenomas with no evidence of malignancy; they were not resected to the best of my knowledge; Dr. Einar Gip feels he can proceed with surgery.  Plan:  Will discuss removing sigmoid and rectal polyps with GI prior to performing partial colectomy.     LOS: 5 days     3 Days Post-Op  Chief Complaint/Subjective: No complaints.  Objective: Vital signs in last 24 hours: Temp:  [98 F (36.7 C)-100.1 F (37.8 C)] 98.2 F (36.8 C) (04/21 1051) Pulse Rate:  [49-56] 55 (04/21 1051) Resp:  [15-16] 16 (04/21 1051) BP: (110-138)/(81-89) 124/85 (04/21 1051) SpO2:  [98 %-100 %] 100 % (04/21 1051) Last BM Date: 01/25/17  Intake/Output from previous day: 04/20 0701 - 04/21 0700 In: 840 [P.O.:840] Out: 820 [Urine:820] Intake/Output this shift: Total I/O In: 360 [P.O.:360] Out: 0   PE: General- In NAD.  Awake and alert.   Lab Results:   Recent Labs  01/26/17 0433 01/27/17 0525  WBC 6.7 7.3  HGB 9.0* 9.4*  HCT 28.1* 28.8*  PLT 196 229   BMET  Recent Labs  01/25/17 0457 01/27/17 0525  NA 140 140  K 3.3* 3.6  CL 107 104  CO2 28 29  GLUCOSE 106* 101*  BUN 10 14  CREATININE 1.19 1.14  CALCIUM 8.5* 8.9   PT/INR No results for input(s): LABPROT, INR in the last 72 hours. Comprehensive Metabolic Panel:    Component Value Date/Time   NA 140 01/27/2017 0525   NA 140 01/25/2017 0457   K 3.6 01/27/2017 0525   K 3.3 (L) 01/25/2017 0457   CL 104 01/27/2017 0525   CL 107 01/25/2017 0457   CO2 29 01/27/2017 0525   CO2 28 01/25/2017 0457   BUN 14 01/27/2017 0525   BUN 10 01/25/2017 0457   CREATININE 1.14 01/27/2017 0525   CREATININE 1.19 01/25/2017 0457   CREATININE 1.12 06/16/2016 1823   GLUCOSE 101 (H) 01/27/2017 0525   GLUCOSE 106 (H) 01/25/2017 0457   CALCIUM 8.9 01/27/2017 0525   CALCIUM 8.5 (L) 01/25/2017 0457   AST 19 01/22/2017 1637   AST 18 06/16/2016 1823   ALT 18 01/22/2017 1637   ALT 18 06/16/2016 1823   ALKPHOS 63 01/22/2017 1637   ALKPHOS 77 06/16/2016 1823   BILITOT 1.0 01/22/2017 1637   BILITOT 0.5 06/16/2016 1823   PROT 7.7 01/22/2017 1637   PROT 8.0 06/16/2016 1823   ALBUMIN 4.3 01/22/2017 1637   ALBUMIN 4.6 06/16/2016 1823     Studies/Results: No results found.  Anti-infectives: Anti-infectives    Start     Dose/Rate Route Frequency Ordered Stop   01/29/17 0800  cefoTEtan in Dextrose 5% (CEFOTAN) IVPB 2 g     2 g Intravenous On call to O.R. 01/26/17 1807 01/30/17 0559   01/29/17 0600  cefoTEtan (CEFOTAN) 2 g in dextrose 5 % 50 mL IVPB  Status:  Discontinued     2 g 100 mL/hr over 30 Minutes Intravenous On call to O.R. 01/26/17 1659 01/26/17 1807   01/28/17 1400  neomycin (MYCIFRADIN) tablet 1,000 mg     1,000 mg Oral 3 times per day 01/26/17 1659 01/29/17 1359   01/28/17 1400  metroNIDAZOLE (FLAGYL) tablet 1,000 mg     1,000 mg Oral 3 times per day 01/26/17 1659 01/29/17 1359       Jahan Friedlander J 01/27/2017

## 2017-01-27 NOTE — Progress Notes (Signed)
Per request of Dr. Zella Richer of Gen. surgery, we have been reactivated on this patient with respect to options for management of his multiple large colonic polyps.  On review of the patient's record, including his endoscopic photographs, I think that there is a fairly high likelihood that an advanced endoscopist at a tertiary care [facility] would be able to remove all of the patient's polyps. The pros and cons of doing this, in comparison to surgery, were reviewed with the patient and his wife at the bedside.  Specifically, endoscopic removal would have the advantage of leaving him with an intact colon, but would necessitate fairly frequent surveillance colonoscopy exams (especially initially), and would potentially be unsuccessful, as well as expose him to a small risk of colonoscopic perforation with attendant need for emergency surgery.  Surgery would definitively remove the tissue, remove any intracolonic cancer that might have been missed on biopsies, and would lessen the risk of recurrent disease because that would be much less colon for new polyps to form in. However, especially if both the proximal polyp with high-grade dysplasia, and the large distal polyps were all to be removed surgically, the patient would be left with a relatively small colonic remnant, with the attendant risk of permanent alteration of bowel habit toward diarrhea.  Plan: After discussing all of the above options, our current plan is to do a colonoscopy prep tomorrow, and unsedated flexible sigmoidoscopy for removal of his distal rectal hemorrhagic polyp on Monday (to prevent risk of rebleeding once his Plavix is restarted), and telephone consultation with Gas[facility] Cola on Monday to see if, conceptually, they think the patient would be an appropriate candidate for a trial of colonoscopic management. If so, the patient could be discharged and that procedure could be done at [facility] electively at some point in the next  several months. If for some reason they do not feel he is a good colonoscopy candidate, we could then proceed with surgical resection of his polyps early next week.   I will also try to get some idea from his cardiologist, Dr. Einar Gip, as to how long this patient can safely remain off Plavix.  Cleotis Nipper, M.D. Pager [phone] If no answer or after 5 PM call [phone]

## 2017-01-27 NOTE — Progress Notes (Signed)
PROGRESS NOTE  Joseph [name]  HYW:[id number] DOB: May 27, 1961 DOA: 01/22/2017 PCP: No PCP Per Patient   Brief Narrative: Joseph [name] is a 56 y.o. male with a history of HTN, HLD, and claudication/PVD on plavix presenting with lower GI bleeding. He reported at least 10 episodes of dark red blood in liquid stool in the previous 12 hours without abdominal pain, vomiting. He reported some dizziness without syncope. Vitals were stable. Hgb was 10.7 from prior of 13. He was admitted and GI was consulted. He underwent colonoscopy 4/18 showing multiple polyps, suspected bleeding source being an anorectal polyp found to be adenoma. Also noted was cecal mass which was found to be adenoma with focal high-grade dysplasia. He has required a transfusion 4/19 due to ongoing blood loss. Plan is for colectomy, timing of which is per surgery, likely 4/23.   Assessment & Plan: Principal Problem:   GI bleed Active Problems:   Accelerated hypertension   Mild hyperlipidemia   Claudication of left lower extremity (HCC)   Adenomatous colon polyps   Polyp of cecum, probable cancer, with bleeding   Acute blood loss anemia  Acute blood loss anemia due to lower GI bleed: Suspected source is anorectal polyp found to be adenoma. - Last bleeding 4/19. Hgb 8.2 > 7.1 > 2u PRBCs > 9 > 9.4 - Continue monitoring CBC daily and clinically.   History of intermediate exercise stress test: No current chest pain. Has very good functional status.  - ECG unchanged, with bradycardia now that beta blocker introduced. Discussed with cardiologist, Dr. Einar Gip, who recommends proceeding with surgery without further work up.  - Added beta-blocker, will decrease dose and hold if HR < 60bpm. Hold ACE/HCTZ - Ok to continue ASA per surgery, and ok to hold plavix per cardiology.  Cecal adenoma with focal high-grade dysplasia: On biopsy of mass from colonoscopy 4/18. - Will require surgery, timing per surgery.    HTN:  Cardiologist is Dr. Einar Gip. Normotensive.  - Continue norvasc, adding low dose metoprolol, holding HTCZ/lisinopril.    HLD -Continue crestor  PVD:  - As above, plan to hold plavix in preparation for surgical intervention. - Continue ASA, statin. - Patient has stopped smoking  Hyperglycemia: Mild fasting hyperglycemia. HbA1c 5.0%.   DVT prophylaxis: SCDs Code Status: Full Family Communication: None at bedside Disposition Plan: Anticipate DC home once stable following colectomy.  Consultants:   GI, Dr. Michail Sermon  General surgery, Dr. Johney Maine  Cardiology, Dr. Einar Gip by phone.  Procedures:  Colonoscopy 01/24/2017 by Dr. Michail Sermon: - One polyp in the sigmoid colon at 20 cm proximal to the anus. Biopsied. Tattooed. - One 30 mm polyp in the sigmoid colon. Biopsied. - Rule out malignancy, tumor in the cecum. Biopsied. - One 20 mm polyp in the rectum. Biopsied. - One 10 mm polyp at the recto-sigmoid colon. - Internal hemorrhoids. - The examined portion of the ileum was normal. Biopsied.  Antimicrobials:  None   Subjective: Still had black/red stool this AM. No diarrhea. No abd pain, N/V. Wants surgery as soon as possible.  Objective: Vitals:   01/26/17 1637 01/26/17 2129 01/27/17 0637 01/27/17 1051  BP: 138/81 134/85 110/89 124/85  Pulse: (!) 49 (!) 56 (!) 54 (!) 55  Resp: 16 15 16 16   Temp: 100.1 F (37.8 C) 98 F (36.7 C) 98.3 F (36.8 C) 98.2 F (36.8 C)  TempSrc: Oral Oral Oral Oral  SpO2: 100% 98% 100% 100%  Weight:      Height:  Intake/Output Summary (Last 24 hours) at 01/27/17 1346 Last data filed at 01/27/17 1051  Gross per 24 hour  Intake             1200 ml  Output              820 ml  Net              380 ml   Filed Weights   01/22/17 2224  Weight: 103 kg (227 lb)    Examination: General exam: 56 y.o. male in no distress laying in bed. No change from yesterday. Respiratory system: Non-labored breathing room air. Clear to auscultation  bilaterally.  Cardiovascular system: Regular rate and rhythm. No murmur, rub, or gallop. No JVD, and no pedal edema. Gastrointestinal system: Abdomen soft, non-tender, non-distended, with normoactive bowel sounds. No organomegaly or masses felt. Central nervous system: Alert and oriented. No focal neurological deficits. Extremities: Warm, no deformities Skin: No rashes, lesions no ulcers Psychiatry: Judgement and insight appear normal. Mood & affect appropriate.   Data Reviewed: I have personally reviewed following labs and imaging studies  CBC:  Recent Labs Lab 01/23/17 2009 01/24/17 0523 01/25/17 0457 01/26/17 0433 01/27/17 0525  WBC 6.0 6.6 6.9 6.7 7.3  HGB 9.0* 8.2* 7.1* 9.0* 9.4*  HCT 27.9* 25.2* 22.2* 28.1* 28.8*  MCV 91.2 91.0 89.5 90.1 90.3  PLT 188 194 169 196 585   Basic Metabolic Panel:  Recent Labs Lab 01/22/17 1637 01/23/17 0500 01/25/17 0457 01/27/17 0525  NA 141 140 140 140  K 3.5 3.2* 3.3* 3.6  CL 105 106 107 104  CO2 28 27 28 29   GLUCOSE 113* 110* 106* 101*  BUN 14 12 10 14   CREATININE 1.08 0.94 1.19 1.14  CALCIUM 9.4 8.7* 8.5* 8.9   GFR: Estimated Creatinine Clearance: 90.5 mL/min (by C-G formula based on SCr of 1.14 mg/dL). Liver Function Tests:  Recent Labs Lab 01/22/17 1637  AST 19  ALT 18  ALKPHOS 63  BILITOT 1.0  PROT 7.7  ALBUMIN 4.3   Radiology Studies: No results found.  Scheduled Meds: . [START ON 01/29/2017] acetaminophen  1,000 mg Oral On Call to OR  . [START ON 01/29/2017] alvimopan  12 mg Oral On Call to OR  . amLODipine  10 mg Oral Daily  . [START ON 01/28/2017] bisacodyl  20 mg Oral Once  . [START ON 01/29/2017] cefoTEtan (CEFOTAN) IV  2 g Intravenous On Call to OR  . [START ON 01/28/2017] chlorhexidine  60 mL Topical Once   And  . [START ON 01/29/2017] chlorhexidine  60 mL Topical Once  . [START ON 01/29/2017] enoxaparin (LOVENOX) injection  40 mg Subcutaneous On Call  . [START ON 01/29/2017] gabapentin  300 mg Oral On  Call to OR  . lisinopril  20 mg Oral Daily   And  . hydrochlorothiazide  12.5 mg Oral Daily  . metoprolol tartrate  25 mg Oral BID  . [START ON 01/28/2017] neomycin  1,000 mg Oral 3 times per day   And  . [START ON 01/28/2017] metroNIDAZOLE  1,000 mg Oral 3 times per day  . rosuvastatin  20 mg Oral q1800   Continuous Infusions: . sodium chloride 20 mL/hr (01/24/17 2322)  . lactated ringers 125 mL/hr (01/24/17 2242)     LOS: 5 days   Time spent: 15 minutes.  Vance Gather, MD [facility] Pager [phone]  If 7PM-7AM, please contact night-coverage [email or web address] Password [email or web address] 01/27/2017, 1:46 PM

## 2017-01-28 DIAGNOSIS — D120 Benign neoplasm of cecum: Secondary | ICD-10-CM

## 2017-01-28 LAB — CBC
HCT: 30.5 % — ABNORMAL LOW (ref 39.0–52.0)
Hemoglobin: 9.8 g/dL — ABNORMAL LOW (ref 13.0–17.0)
MCH: 29.1 pg (ref 26.0–34.0)
MCHC: 32.1 g/dL (ref 30.0–36.0)
MCV: 90.5 fL (ref 78.0–100.0)
Platelets: 242 K/uL (ref 150–400)
RBC: 3.37 MIL/uL — ABNORMAL LOW (ref 4.22–5.81)
RDW: 14.2 % (ref 11.5–15.5)
WBC: 8.3 K/uL (ref 4.0–10.5)

## 2017-01-28 LAB — HEMOGLOBIN A1C
Hgb A1c MFr Bld: 5.1 % (ref 4.8–5.6)
MEAN PLASMA GLUCOSE: 100 mg/dL

## 2017-01-28 MED ORDER — PEG 3350-KCL-NA BICARB-NACL 420 G PO SOLR
4000.0000 mL | Freq: Once | ORAL | Status: AC
  Administered 2017-01-28: 4000 mL via ORAL

## 2017-01-28 NOTE — Progress Notes (Signed)
PROGRESS NOTE  Joseph [name]  JHE:[id number] DOB: December 29, 1960 DOA: 01/22/2017 PCP: No PCP Per Patient   Brief Narrative: Joseph [name] is a 56 y.o. male with a history of HTN, HLD, and claudication/PVD on plavix presenting with lower GI bleeding. He reported at least 10 episodes of dark red blood in liquid stool in the previous 12 hours without abdominal pain, vomiting. He reported some dizziness without syncope. Vitals were stable. Hgb was 10.7 from prior of 13. He was admitted and GI was consulted. He underwent colonoscopy 4/18 showing multiple polyps, suspected bleeding source being an anorectal polyp found to be adenoma. Also noted was cecal mass which was found to be adenoma with focal high-grade dysplasia. He has required a transfusion 4/19 due to ongoing blood loss. Plan is for colectomy, timing of which is per surgery, likely 4/23.   Assessment & Plan: Principal Problem:   GI bleed Active Problems:   Accelerated hypertension   Mild hyperlipidemia   Claudication of left lower extremity (HCC)   Adenomatous colon polyps   Polyp of cecum, probable cancer, with bleeding   Acute blood loss anemia  Acute blood loss anemia due to lower GI bleed: Suspected source is anorectal polyp found to be adenoma. - Last bleeding 4/21; Hgb 8.2 > 7.1 > 2u PRBCs > 9 > 9.4 > 9.8 - Continue monitoring CBC daily and clinically.  - Flex sigmoidoscopy 4/23 per Dr. Cristina Gong to remove rectal hemorrhagic polyp to reduce risk of rebleeding with reinstatement of plavix.  Cecal adenoma with focal high-grade dysplasia: On biopsy of mass from colonoscopy 4/18. Also with multiple left-sided colonic polyps.  - GI planning to discuss with [facility] regarding candidacy for colonoscopic management of polyps - Surgery following. If not amenable to mucosal resection, will require colectomy.  History of intermediate exercise stress test: No current chest pain. Has very good functional status.  - ECG  unchanged, with bradycardia now that beta blocker introduced. Discussed with cardiologist, Dr. Einar Gip, who recommends proceeding with surgery without further work up.  - Added beta-blocker, will decrease dose and hold if HR < 60bpm. Hold ACE/HCTZ - Ok to continue ASA per surgery, and ok to hold plavix per cardiology.  HTN: Cardiologist is Dr. Einar Gip. Normotensive.  - Continue norvasc, adding low dose metoprolol, holding HTCZ/lisinopril.    HLD -Continue crestor  PVD:  - As above, plan to hold plavix in preparation for surgical intervention. - Continue ASA, statin. - Patient has stopped smoking  Hyperglycemia: Mild fasting hyperglycemia. HbA1c 5.0%.   DVT prophylaxis: SCDs Code Status: Full Family Communication: None at bedside Disposition Plan: [place] home pending further work up 4/23.  Consultants:   GI, Dr. Michail Sermon  General surgery, Dr. Johney Maine  Cardiology, Dr. Einar Gip by phone.  Procedures:  Colonoscopy 01/24/2017 by Dr. Michail Sermon: - One polyp in the sigmoid colon at 20 cm proximal to the anus. Biopsied. Tattooed. - One 30 mm polyp in the sigmoid colon. Biopsied. - Rule out malignancy, tumor in the cecum. Biopsied. - One 20 mm polyp in the rectum. Biopsied. - One 10 mm polyp at the recto-sigmoid colon. - Internal hemorrhoids. - The examined portion of the ileum was normal. Biopsied.  Antimicrobials:  None   Subjective: No further bleeding or other complaints.  Objective: Vitals:   01/27/17 2059 01/28/17 0458 01/28/17 0900 01/28/17 1001  BP: (!) 128/91 122/79  125/78  Pulse: 60 (!) 51  (!) 54  Resp: 16 16  16   Temp: 98.2 F (36.8  C) 97.6 F (36.4 C) 98 F (36.7 C) 98 F (36.7 C)  TempSrc: Oral Oral Oral Oral  SpO2: 98% 99%  100%  Weight:      Height:        Intake/Output Summary (Last 24 hours) at 01/28/17 1509 Last data filed at 01/28/17 1000  Gross per 24 hour  Intake             2070 ml  Output              926 ml  Net             1144 ml     Filed Weights   01/22/17 2224  Weight: 103 kg (227 lb)    Examination: General exam: 56 y.o. male in no distress laying in bed. No change from prior exams. Respiratory system: Non-labored breathing room air. Clear to auscultation bilaterally.  Cardiovascular system: Regular rate and rhythm. No murmur, rub, or gallop. No JVD, and no pedal edema. Gastrointestinal system: Abdomen soft, non-tender, non-distended, with normoactive bowel sounds. No organomegaly or masses felt. Central nervous system: Alert and oriented. No focal neurological deficits. Extremities: Warm, no deformities Skin: No rashes, lesions no ulcers Psychiatry: Judgement and insight appear normal. Mood & affect appropriate.   Data Reviewed: I have personally reviewed following labs and imaging studies  CBC:  Recent Labs Lab 01/24/17 0523 01/25/17 0457 01/26/17 0433 01/27/17 0525 01/28/17 0502  WBC 6.6 6.9 6.7 7.3 8.3  HGB 8.2* 7.1* 9.0* 9.4* 9.8*  HCT 25.2* 22.2* 28.1* 28.8* 30.5*  MCV 91.0 89.5 90.1 90.3 90.5  PLT 194 169 196 229 010   Basic Metabolic Panel:  Recent Labs Lab 01/22/17 1637 01/23/17 0500 01/25/17 0457 01/27/17 0525  NA 141 140 140 140  K 3.5 3.2* 3.3* 3.6  CL 105 106 107 104  CO2 28 27 28 29   GLUCOSE 113* 110* 106* 101*  BUN 14 12 10 14   CREATININE 1.08 0.94 1.19 1.14  CALCIUM 9.4 8.7* 8.5* 8.9   GFR: Estimated Creatinine Clearance: 90.5 mL/min (by C-G formula based on SCr of 1.14 mg/dL). Liver Function Tests:  Recent Labs Lab 01/22/17 1637  AST 19  ALT 18  ALKPHOS 63  BILITOT 1.0  PROT 7.7  ALBUMIN 4.3   Radiology Studies: No results found.  Scheduled Meds: . [START ON 01/29/2017] acetaminophen  1,000 mg Oral On Call to OR  . [START ON 01/29/2017] alvimopan  12 mg Oral On Call to OR  . amLODipine  10 mg Oral Daily  . [START ON 01/29/2017] cefoTEtan (CEFOTAN) IV  2 g Intravenous On Call to OR  . chlorhexidine  60 mL Topical Once   And  . [START ON 01/29/2017]  chlorhexidine  60 mL Topical Once  . [START ON 01/29/2017] enoxaparin (LOVENOX) injection  40 mg Subcutaneous On Call  . [START ON 01/29/2017] gabapentin  300 mg Oral On Call to OR  . lisinopril  20 mg Oral Daily   And  . hydrochlorothiazide  12.5 mg Oral Daily  . metoprolol tartrate  12.5 mg Oral BID  . neomycin  1,000 mg Oral 3 times per day   And  . metroNIDAZOLE  1,000 mg Oral 3 times per day  . rosuvastatin  20 mg Oral q1800   Continuous Infusions: . sodium chloride 75 mL/hr at 01/28/17 0856     LOS: 6 days   Time spent: 15 minutes.  Vance Gather, MD [facility] Pager [phone]  If 7PM-7AM, please  contact night-coverage [email or web address] Password [facility] 01/28/2017, 3:09 PM

## 2017-01-28 NOTE — Progress Notes (Signed)
Patient ID: Joseph [name], male   DOB: 1961-02-09, 56 y.o.   MRN: [id number] [facility] Surgery Progress Note:   4 Days Post-Op  Subjective: Mental status is clear.   Objective: Vital signs in last 24 hours: Temp:  [97.6 F (36.4 C)-98.2 F (36.8 C)] 97.6 F (36.4 C) (04/22 0458) Pulse Rate:  [51-60] 51 (04/22 0458) Resp:  [16] 16 (04/22 0458) BP: (122-129)/(79-91) 122/79 (04/22 0458) SpO2:  [98 %-100 %] 99 % (04/22 0458)  Intake/Output from previous day: 04/21 0701 - 04/22 0700 In: 2040 [P.O.:1120; I.V.:920] Out: 926 [Urine:925; Stool:1] Intake/Output this shift: No intake/output data recorded.  Physical Exam: Work of breathing is not labored.  No abdominal discomfort.    Lab Results:  Results for orders placed or performed during the hospital encounter of 01/22/17 (from the past 48 hour(s))  Hemoglobin A1c     Status: None   Collection Time: 01/26/17  5:17 PM  Result Value Ref Range   Hgb A1c MFr Bld 5.0 4.8 - 5.6 %    Comment: (NOTE)         Pre-diabetes: 5.7 - 6.4         Diabetes: >6.4         Glycemic control for adults with diabetes: <7.0    Mean Plasma Glucose 97 mg/dL    Comment: (NOTE) Performed At: [facility] [place], [place] [id number] Lindon Romp MD HU:[id number]   CBC     Status: Abnormal   Collection Time: 01/27/17  5:25 AM  Result Value Ref Range   WBC 7.3 4.0 - 10.5 K/uL   RBC 3.19 (L) 4.22 - 5.81 MIL/uL   Hemoglobin 9.4 (L) 13.0 - 17.0 g/dL   HCT 28.8 (L) 39.0 - 52.0 %   MCV 90.3 78.0 - 100.0 fL   MCH 29.5 26.0 - 34.0 pg   MCHC 32.6 30.0 - 36.0 g/dL   RDW 14.3 11.5 - 15.5 %   Platelets 229 150 - 400 K/uL  Basic metabolic panel     Status: Abnormal   Collection Time: 01/27/17  5:25 AM  Result Value Ref Range   Sodium 140 135 - 145 mmol/L   Potassium 3.6 3.5 - 5.1 mmol/L   Chloride 104 101 - 111 mmol/L   CO2 29 22 - 32 mmol/L   Glucose, Bld 101 (H) 65 - 99 mg/dL   BUN 14 6 - 20 mg/dL   Creatinine,  Ser 1.14 0.61 - 1.24 mg/dL   Calcium 8.9 8.9 - 10.3 mg/dL   GFR calc non Af Amer >60 >60 mL/min   GFR calc Af Amer >60 >60 mL/min    Comment: (NOTE) The eGFR has been calculated using the CKD EPI equation. This calculation has not been validated in all clinical situations. eGFR's persistently <60 mL/min signify possible Chronic Kidney Disease.    Anion gap 7 5 - 15  CBC     Status: Abnormal   Collection Time: 01/28/17  5:02 AM  Result Value Ref Range   WBC 8.3 4.0 - 10.5 K/uL   RBC 3.37 (L) 4.22 - 5.81 MIL/uL   Hemoglobin 9.8 (L) 13.0 - 17.0 g/dL   HCT 30.5 (L) 39.0 - 52.0 %   MCV 90.5 78.0 - 100.0 fL   MCH 29.1 26.0 - 34.0 pg   MCHC 32.1 30.0 - 36.0 g/dL   RDW 14.2 11.5 - 15.5 %   Platelets 242 150 - 400 K/uL    Radiology/Results: No results  found.  Anti-infectives: Anti-infectives    Start     Dose/Rate Route Frequency Ordered Stop   01/29/17 0800  cefoTEtan in Dextrose 5% (CEFOTAN) IVPB 2 g     2 g Intravenous On call to O.R. 01/26/17 1807 01/30/17 0559   01/29/17 0600  cefoTEtan (CEFOTAN) 2 g in dextrose 5 % 50 mL IVPB  Status:  Discontinued     2 g 100 mL/hr over 30 Minutes Intravenous On call to O.R. 01/26/17 1659 01/26/17 1807   01/28/17 1400  neomycin (MYCIFRADIN) tablet 1,000 mg     1,000 mg Oral 3 times per day 01/26/17 1659 01/29/17 1359   01/28/17 1400  metroNIDAZOLE (FLAGYL) tablet 1,000 mg     1,000 mg Oral 3 times per day 01/26/17 1659 01/29/17 1359      Assessment/Plan: Problem List: Patient Active Problem List   Diagnosis Date Noted  . Adenomatous colon polyps 01/26/2017  . Polyp of cecum, probable cancer, with bleeding 01/26/2017  . Acute blood loss anemia 01/26/2017  . GI bleed 01/22/2017  . Atypical chest pain 07/01/2016  . Accelerated hypertension 07/01/2016  . Nonspecific abnormal electrocardiogram (ECG) (EKG) 07/01/2016  . Mild hyperlipidemia 07/01/2016  . Tobacco use disorder 07/01/2016  . Congenital hip dysplasia 07/01/2016  . Aortic  atherosclerosis ([place]) 07/01/2016  . Claudication of left lower extremity ([place]) 07/01/2016  . Inguinal hernia unilateral, non-recurrent 08/08/2011    Plan for sigmoidoscopy tomorrow per Dr. Cristina Gong and then mucosal resection at [facility].   4 Days Post-Op    LOS: 6 days   Matt B. Hassell Done, MD, [facility] [phone] beeper [phone]  01/28/2017 8:47 AM

## 2017-01-28 NOTE — Progress Notes (Signed)
I confirmed with the patient our plan for sigmoidoscopic excision of his distal rectal tubular adenoma which is the presumed source of his bleeding prior to admission. Risks reviewed, specifically hemorrhage and discomfort because of its proximity to the anus.  I will plan to try to contact the advanced procedures unit at [facility] GI division tomorrow to have them review the patient's case for suitability for colonoscopic endoscopic mucosal resection of his multiple large polyps, particularly the one in the cecum that has high-grade dysplasia. If they feel that the patient is suitable, we will try to arrange a time for that to be done. If they do not, the patient will be prepped and ready for surgical colectomy later in the week.   I also intend to confirm with the patient's cardiologist, Dr. Adrian Prows, along the patient can safely remain off Plavix for his peripheral vascular disease.  Cleotis Nipper, M.D. Pager [phone] If no answer or after 5 PM call [phone]

## 2017-01-29 ENCOUNTER — Encounter (HOSPITAL_COMMUNITY)
Admission: EM | Disposition: A | Discharge status: Home / Self Care | Payer: Self-pay | Source: Home / Self Care | Attending: Family Medicine

## 2017-01-29 ENCOUNTER — Encounter (HOSPITAL_COMMUNITY): Payer: Self-pay | Admitting: *Deleted

## 2017-01-29 ENCOUNTER — Inpatient Hospital Stay (HOSPITAL_COMMUNITY): Payer: BC Managed Care – PPO | Admitting: Anesthesiology

## 2017-01-29 HISTORY — PX: FLEXIBLE SIGMOIDOSCOPY: SHX5431

## 2017-01-29 LAB — TYPE AND SCREEN
ABO/RH(D): A POS
ANTIBODY SCREEN: NEGATIVE

## 2017-01-29 SURGERY — COLECTOMY, RIGHT, LAPAROSCOPIC
Anesthesia: General | Laterality: Right

## 2017-01-29 SURGERY — SIGMOIDOSCOPY, FLEXIBLE

## 2017-01-29 MED ORDER — SODIUM CHLORIDE 0.9 % IV SOLN: INTRAVENOUS | Status: DC

## 2017-01-29 MED ORDER — MIDAZOLAM HCL 5 MG/ML IJ SOLN
INTRAMUSCULAR | Status: AC
  Filled 2017-01-29: qty 2

## 2017-01-29 MED ORDER — FENTANYL CITRATE (PF) 100 MCG/2ML IJ SOLN
INTRAMUSCULAR | Status: AC
  Filled 2017-01-29: qty 2

## 2017-01-29 MED ORDER — MIDAZOLAM HCL 10 MG/2ML IJ SOLN
INTRAMUSCULAR | Status: DC | PRN
  Administered 2017-01-29: 1 mg via INTRAVENOUS
  Administered 2017-01-29 (×2): 2 mg via INTRAVENOUS

## 2017-01-29 NOTE — Interval H&P Note (Signed)
History and Physical Interval Note:  01/29/2017 8:06 AM  Joseph [name]  has presented today for colonoscopy, with the diagnosis of Rectal polyp  The various methods of treatment have been discussed with the patient and family. After consideration of risks, benefits and other options for treatment, the patient has consented to  Procedure(s): FLEXIBLE SIGMOIDOSCOPY (N/A) as a surgical intervention .  The patient's history has been reviewed, patient examined, no change in status, stable for surgery.  I have reviewed the patient's chart and labs.  Questions were answered to the patient's satisfaction.     Cleotis Nipper

## 2017-01-29 NOTE — Anesthesia Preprocedure Evaluation (Deleted)
Anesthesia Evaluation  Patient identified by MRN, date of birth, ID band Patient awake    Reviewed: Allergy & Precautions, NPO status , Patient's Chart, lab work & pertinent test results  Airway Mallampati: II  TM Distance: >3 FB Neck ROM: Full    Dental no notable dental hx.    Pulmonary neg pulmonary ROS, former smoker,    Pulmonary exam normal breath sounds clear to auscultation       Cardiovascular hypertension, + Peripheral Vascular Disease  Normal cardiovascular exam Rhythm:Regular Rate:Normal     Neuro/Psych negative neurological ROS  negative psych ROS   GI/Hepatic negative GI ROS, Neg liver ROS,   Endo/Other  negative endocrine ROS  Renal/GU negative Renal ROS  negative genitourinary   Musculoskeletal negative musculoskeletal ROS (+)   Abdominal   Peds negative pediatric ROS (+)  Hematology  (+) anemia ,   Anesthesia Other Findings   Reproductive/Obstetrics negative OB ROS                             Anesthesia Physical Anesthesia Plan  ASA: III  Anesthesia Plan: General   Post-op Pain Management:    Induction: Intravenous  Airway Management Planned: Oral ETT  Additional Equipment:   Intra-op Plan:   Post-operative Plan: Extubation in OR  Informed Consent: I have reviewed the patients History and Physical, chart, labs and discussed the procedure including the risks, benefits and alternatives for the proposed anesthesia with the patient or authorized representative who has indicated his/her understanding and acceptance.   Dental advisory given  Plan Discussed with: CRNA and Surgeon  Anesthesia Plan Comments:         Anesthesia Quick Evaluation

## 2017-01-29 NOTE — Discharge Summary (Signed)
Physician Discharge Summary  Avrey Hyser III OVF:[id number] DOB: 1961-05-29 DOA: 01/22/2017  PCP: No PCP Per Patient  Admit date: 01/22/2017 Discharge date: 01/29/2017  Admitted From: Home Disposition: Home   Recommendations for Outpatient Follow-up:  1. Follow up with PCP in 1-2 weeks 2. Plan to follow up pathology report from polyps removed with flex sigmoidoscopy 4/23. Dr. Cristina Gong to coordinate follow up with tertiary care GI specialist for endoscopic resection. If not felt to be amenable, pt will be referred to general surgery for colectomy.  3. Obtain CBC to monitor anemia. HOLDING aspirin and plavix.  4. Follow up with cardiology, Dr. Einar Gip. Consider adding beta blocker.   Home Health: None Equipment/Devices: None Discharge Condition: Stable CODE STATUS: Full Diet recommendation: Heart healthy  Brief/Interim Summary: Darcella Gasman IIIis a 56 y.o.malewith a history of HTN, HLD, and claudication/PVD on plavix presenting with lower GI bleeding.He reported at least 10 episodes of dark red blood in liquid stool in the previous 12 hours without abdominal pain, vomiting. He reported some dizziness without syncope. Vitals were stable. Hgb was 10.7 from prior of 13. He was admitted and GI was consulted. He underwent colonoscopy 4/18 showing multiple polyps, suspected bleeding source being an anorectal polyp found to be adenoma. Also noted was cecal mass which was found to be adenoma with focal high-grade dysplasia. General surgery was consulted for consideration of colectomy, though there was concern that left-sided polyps would need to be resected and may be able to be removed endoscopically. He underwent sigmoidoscopy 4/23 with resection of 2 polyps, including rectal polyp thought to be primary cause of GI bleeding. Pathology results are pending at discharge, and Dr. Cristina Gong will contact endoscopic specialists at [facility] to see if his lesions, including the cecal mass may be  amenable to endoscopic resection. If not, the patient will be referred to general surgery for colectomy.  Discharge Diagnoses:  Principal Problem:   GI bleed Active Problems:   Accelerated hypertension   Mild hyperlipidemia   Claudication of left lower extremity (HCC)   Adenomatous colon polyps   Polyp of cecum, probable cancer, with bleeding   Acute blood loss anemia  Acute blood loss anemia due to lower GI bleed: Suspected source is anorectal polyp found to be adenoma. - Last bleeding 4/21; Hgb 8.2 > 7.1 > 2u PRBCs > 9 > 9.4 > 9.8 - Continue monitoring CBC at follow up - Flex sigmoidoscopy 4/23 per Dr. Cristina Gong to remove rectal hemorrhagic polyp to reduce risk of rebleeding    Cecal adenoma with focal high-grade dysplasia: On biopsy of mass from colonoscopy 4/18. Also with multiple left-sided colonic polyps.  - GI planning to discuss with [facility] regarding candidacy for colonoscopic management of polyps - Surgery following. If not amenable to mucosal resection, will require colectomy. Dr. Cristina Gong to discuss further with surgery.  History of intermediate exercise stress test: No current chest pain. Has very good functional status.  - ECG unchanged, with bradycardia now that beta blocker introduced. Discussed with cardiologist, Dr. Einar Gip, who recommends proceeding with surgery without further work up and ok to hold aspirin and plavix indefinitely until surgery. - Added beta-blocker, but had bradycardia, so will defer to cardiology as outpatient.   HTN: Cardiologist is Dr. Einar Gip. Normotensive.  - Continue norvasc, adding low dose metoprolol, holding HTCZ/lisinopril.    HLD -Continue crestor  PVD:  - Holding ASA and plavix as above.  - Continue statin. - Patient has stopped smoking  Hyperglycemia: Mild fasting hyperglycemia.  HbA1c 5.0%.   Discharge Instructions Discharge Instructions    Discharge instructions    Complete by:  As directed    You wre admitted for  GI bleeding due to a bleeding polyp. This has resolved and the polyp has been removed. You are stable for discharge with the following recommendations:  - STOP taking aspirin and plavix - Continue taking other medications.  - You will be contacted by Dr. Cristina Gong to arrange appropriate follow up.  - If your bleeding returns, seek medical attention right away.     Allergies as of 01/29/2017   No Known Allergies     Medication List    STOP taking these medications   aspirin 81 MG chewable tablet   clopidogrel 75 MG tablet Commonly known as:  PLAVIX   ibuprofen 200 MG tablet Commonly known as:  ADVIL,MOTRIN     TAKE these medications   amLODipine 10 MG tablet Commonly known as:  NORVASC Take 10 mg by mouth daily.   lisinopril-hydrochlorothiazide 20-12.5 MG tablet Commonly known as:  PRINZIDE,ZESTORETIC TAKE 1 TABLET BY MOUTH DAILY.   rosuvastatin 20 MG tablet Commonly known as:  CRESTOR Take 20 mg by mouth daily.      Follow-up Information    Adrian Prows, MD Follow up.   Specialty:  Cardiology Contact information: [address] [place] [address] [phone]        Adin Hector., MD .   Specialty:  General Surgery Contact information: [place] [address] [phone]        Cleotis Nipper, MD Follow up.   Specialty:  Gastroenterology Why:  You will be contacted. Contact information: [address] [place] [address] [phone]          No Known Allergies  Consultations: GI, Dr. Cristina Gong Surgery, Dr. Hassell Done  Procedures/Studies: Colonoscopy 01/24/2017 by Dr. Michail Sermon: - One polyp in the sigmoid colon at 20 cm proximalto the anus. Biopsied. Tattooed. - One 30 mm polyp in the sigmoid colon. Biopsied. - Rule out malignancy, tumor in the cecum. Biopsied. - One 20 mm polyp in the rectum. Biopsied. - One 10 mm polyp at the recto-sigmoid colon. - Internal hemorrhoids. - The examined  portion of the ileum was normal. Biopsied.  Sigmoidoscopy 01/29/2017 by Dr. Cristina Gong: - One benign appearing 10 mm polyp in the rectum, removed with a hot snare. Resected and retrieved. - One benign appearing 14 mm polyp in the rectum, removed with a hot snare. Resected and retrieved. - Large sigmoid polyps not removed at this time.  Subjective: Pt without additional bleeding, no abd pain. Feels well. Wants to go home.  Discharge Exam: BP (!) 147/97   Pulse (!) 59   Temp 97.8 F (36.6 C) (Oral)   Resp 20   Ht 5\' 11"  (1.803 m)   Wt 103 kg (227 lb)   SpO2 97%   BMI 31.66 kg/m   General: Pt is alert, awake, not in acute distress Cardiovascular: RRR, S1/S2 +, no rubs, no gallops Respiratory: CTA bilaterally, no wheezing, no rhonchi Abdominal: Soft, NT, ND, bowel sounds + Extremities: No edema, no cyanosis  Labs: Basic Metabolic Panel:  Recent Labs Lab 01/22/17 1637 01/23/17 0500 01/25/17 0457 01/27/17 0525  NA 141 140 140 140  K 3.5 3.2* 3.3* 3.6  CL 105 106 107 104  CO2 28 27 28 29   GLUCOSE 113* 110* 106* 101*  BUN 14 12 10 14   CREATININE 1.08 0.94  1.19 1.14  CALCIUM 9.4 8.7* 8.5* 8.9   Liver Function Tests:  Recent Labs Lab 01/22/17 1637  AST 19  ALT 18  ALKPHOS 63  BILITOT 1.0  PROT 7.7  ALBUMIN 4.3   CBC:  Recent Labs Lab 01/24/17 0523 01/25/17 0457 01/26/17 0433 01/27/17 0525 01/28/17 0502  WBC 6.6 6.9 6.7 7.3 8.3  HGB 8.2* 7.1* 9.0* 9.4* 9.8*  HCT 25.2* 22.2* 28.1* 28.8* 30.5*  MCV 91.0 89.5 90.1 90.3 90.5  PLT 194 169 196 229 242   Hgb A1c  Recent Labs  01/26/17 1717 01/27/17 0525  HGBA1C 5.0 5.1    Time coordinating discharge: Approximately 40 minutes  Vance Gather, MD  [facility] 01/29/2017, 1:36 PM Pager [phone]  If 7PM-7AM, please contact night-coverage [email or web address] Password [email or web address]

## 2017-01-29 NOTE — Op Note (Signed)
[facility] Patient Name: Joseph [name] Procedure Date: 01/29/2017 MRN: [id number] Attending MD: Ronald Lobo , MD Date of Birth: [birth year] CSN: [id number] Age: 56 Admit Type: Outpatient Procedure:                Flexible Sigmoidoscopy Indications:              Personal history of colonic polyps seen, but not                            removed, on recent colonoscopy. Patient has large                            polyps in cecum and sigmoid awaiting possible                            excision by an advanced endoscopist at a medical                            center, vs. surgical resection. However, patient                            had recent rectal bleeding while on Plavix for PVD                            attributed to a friable tubular adenoma in the anal                            canal. The intent of this procedure is to remove                            that polyp so it doesn't bleed further. Providers:                Ronald Lobo, MD, Zenon Mayo, RN, William Dalton, Technician, Marcene Duos, Technician Referring MD:              Medicines:                Midazolam 5 mg IV, Sedation Administered by an                            Endoscopy Nurse Complications:            No immediate complications. Estimated Blood Loss:     Estimated blood loss: 3 mL. Procedure:                Pre-Anesthesia Assessment:                           - Prior to the procedure, a History and Physical                            was performed, and patient medications and  allergies were reviewed. The patient's tolerance of                            previous anesthesia was also reviewed. The risks                            and benefits of the procedure and the sedation                            options and risks were discussed with the patient.                            All questions were answered, and informed consent                            was obtained. Prior Anticoagulants: The patient has                            taken ASA and Plavix (clopidogrel), last dose was 7                            days prior to procedure. ASA Grade Assessment: III                            - A patient with severe systemic disease. After                            reviewing the risks and benefits, the patient was                            deemed in satisfactory condition to undergo the                            procedure.                           After obtaining informed consent, the scope was                            passed under direct vision. The EC-[id number] ([id number])                            scope was introduced through the anus and advanced                            to the the sigmoid colon. The flexible                            sigmoidoscopy was accomplished without difficulty.                            The patient tolerated the procedure well. The  quality of the bowel preparation was excellent. Scope In: 8:32:24 AM Scope Out: 8:44:24 AM Total Procedure Duration: 0 hours 12 minutes 0 seconds  Findings:      The perianal examination was normal.      The digital rectal exam findings include palpable small polyp in the       proximal anal canal. Pertinent negatives include normal prostate (size,       shape, and consistency).      A 10 mm polyp was found in the mid-rectum (benign-appearing lesion). The       polyp was semi-pedunculated. The polyp was removed with a hot snare.       Resection and retrieval were complete. Estimated blood loss: none.      A 14 mm polyp was found in the distal rectum (benign-appearing lesion),       actually in the proximal anal canal with its stalk originating a few mm       above the dentate line. The polyp was pedunculated. The polyp was       removed with a hot snare, trying to get high up on the stalk to increase       the margin between the  cautery eschar and the dentate line. Resection       and retrieval were complete. There was transient oozing which stopped       spontaneously within a minute; estimated blood loss: 3 mL.      In the mid-sigmoid, I saw 2 previously-noted large pedunculated polyps       (larger one ? 4-5 cm in diam); I did not attempt removal, in       anticipation of probable referral to a medical center in near future for       removal of his cecal TVA w/ HGD, during which time these polyps would       also be able to be removed w/ optimal technique. Impression:               - One benign appearing 10 mm polyp in the rectum,                            removed with a hot snare. Resected and retrieved.                           - One benign appearing 14 mm polyp in the rectum,                            removed with a hot snare. Resected and retrieved.                           - Large sigmoid polyps not removed at this time. Moderate Sedation:      Moderate (conscious) sedation was administered by the endoscopy nurse       and supervised by the endoscopist. The following parameters were       monitored: oxygen saturation, heart rate, blood pressure, and response       to care. Total physician intraservice time was 18 minutes. Recommendation:           - Await pathology results.                           -  Refer for a second opinion with advanced                            endoscopist at appointment to be scheduled. Procedure Code(s):        --- Professional ---                           [phone], Sigmoidoscopy, flexible; with removal of                            tumor(s), polyp(s), or other lesion(s) by snare                            technique Diagnosis Code(s):        --- Professional ---                           K62.1, Rectal polyp CPT copyright 2016 American Medical Association. All rights reserved. The codes documented in this report are preliminary and upon coder review may  be revised to meet  current compliance requirements. Ronald Lobo, MD 01/29/2017 9:06:46 AM This report has been signed electronically. Number of Addenda: 0

## 2017-01-29 NOTE — Progress Notes (Signed)
Sigmoidoscopy was well tolerated with mild sedation. 2 polyps were removed from the rectum. One of these was in the proximal portion of the anal canal and bled slightly, transiently, with removal but then there was spontaneous hemostasis.  In the meantime, I have spoken with the patient's cardiologist, Dr. Adrian Prows, who said that it is perfectly fine for the patient to be off his Plavix indefinitely while we are sorting out the polyp issue. He may even be off his aspirin, as needed, so I will plan to keep the patient off aspirin for about a week to allow healing of the eschar in the anal canal.  From my standpoint, the patient can be discharged any time today. In the meantime, I have ordered a diet for him.  I intend to contact the advanced endoscopy section at the GI division at [facility] and I will be in contact with the patient regarding there are plans for him.  Please call me if you have any questions.  Cleotis Nipper, M.D. Pager [phone] If no answer or after 5 PM call [phone]

## 2017-01-30 ENCOUNTER — Encounter (HOSPITAL_COMMUNITY): Payer: Self-pay | Admitting: Gastroenterology

## 2017-06-14 ENCOUNTER — Other Ambulatory Visit: Payer: Self-pay | Admitting: General Surgery

## 2017-06-27 NOTE — Pre-Procedure Instructions (Signed)
Joseph [name]  06/27/2017      [id number] Joseph [name], Joseph [name] [address] [place] [address] Phone: [phone] Fax: [phone]    Your procedure is scheduled on July 03, 2017.  Report to [facility] Admitting at 530 AM.  Call this number if you have problems the morning of surgery:  [phone]   Remember:  Do not eat food or drink liquids after midnight.  Take these medicines the morning of surgery with A SIP OF WATER amlodipine (norvasc).  7 days prior to surgery STOP taking any Aspirin, Aleve, Naproxen, Ibuprofen, Motrin, Advil, Goody's, BC's, all herbal medications, fish oil, and all vitamins   Do not wear jewelry, make-up or nail polish.  Do not wear lotions, powders, or colognes, or deoderant.  Men may shave face and neck.  Do not bring valuables to the hospital.  [facility] is not responsible for any belongings or valuables.  Contacts, dentures or bridgework may not be worn into surgery.  Leave your suitcase in the car.  After surgery it may be brought to your room.  For patients admitted to the hospital, discharge time will be determined by your treatment team.  Patients discharged the day of surgery will not be allowed to drive home.   Special instructions:   [place]- Preparing For Surgery  Before surgery, you can play an important role. Because skin is not sterile, your skin needs to be as free of germs as possible. You can reduce the number of germs on your skin by washing with CHG (chlorahexidine gluconate) Soap before surgery.  CHG is an antiseptic cleaner which kills germs and bonds with the skin to continue killing germs even after washing.  Please do not use if you have an allergy to CHG or antibacterial soaps. If your skin becomes reddened/irritated stop using the CHG.  Do not shave (including legs and underarms) for at least 48 hours prior to first CHG shower. It is OK to shave your  face.  Please follow these instructions carefully.   1. Shower the NIGHT BEFORE SURGERY and the MORNING OF SURGERY with CHG.   2. If you chose to wash your hair, wash your hair first as usual with your normal shampoo.  3. After you shampoo, rinse your hair and body thoroughly to remove the shampoo.  4. Use CHG as you would any other liquid soap. You can apply CHG directly to the skin and wash gently with a scrungie or a clean washcloth.   5. Apply the CHG Soap to your body ONLY FROM THE NECK DOWN.  Do not use on open wounds or open sores. Avoid contact with your eyes, ears, mouth and genitals (private parts). Wash genitals (private parts) with your normal soap.  6. Wash thoroughly, paying special attention to the area where your surgery will be performed.  7. Thoroughly rinse your body with warm water from the neck down.  8. DO NOT shower/wash with your normal soap after using and rinsing off the CHG Soap.  9. Pat yourself dry with a CLEAN TOWEL.   10. Wear CLEAN PAJAMAS   11. Place CLEAN SHEETS on your bed the night of your first shower and DO NOT SLEEP WITH PETS.    Day of Surgery: Do not apply any deodorants/lotions. Please wear clean clothes to the hospital/surgery center.     Please read over the following fact sheets that you were given. Pain Booklet, Coughing and Deep  Breathing and Surgical Site Infection Prevention

## 2017-06-28 ENCOUNTER — Encounter (HOSPITAL_COMMUNITY)
Admission: RE | Admit: 2017-06-28 | Discharge: 2017-06-28 | Disposition: A | Discharge status: Home / Self Care | Payer: BC Managed Care – PPO | Source: Ambulatory Visit | Attending: General Surgery | Admitting: General Surgery

## 2017-06-28 ENCOUNTER — Encounter (HOSPITAL_COMMUNITY): Payer: Self-pay

## 2017-06-28 DIAGNOSIS — Z01812 Encounter for preprocedural laboratory examination: Secondary | ICD-10-CM | POA: Insufficient documentation

## 2017-06-28 DIAGNOSIS — K635 Polyp of colon: Secondary | ICD-10-CM | POA: Diagnosis not present

## 2017-06-28 LAB — CBC
HCT: 39.4 % (ref 39.0–52.0)
Hemoglobin: 12.4 g/dL — ABNORMAL LOW (ref 13.0–17.0)
MCH: 27.7 pg (ref 26.0–34.0)
MCHC: 31.5 g/dL (ref 30.0–36.0)
MCV: 88.1 fL (ref 78.0–100.0)
Platelets: 264 10*3/uL (ref 150–400)
RBC: 4.47 MIL/uL (ref 4.22–5.81)
RDW: 15.1 % (ref 11.5–15.5)
WBC: 5.1 10*3/uL (ref 4.0–10.5)

## 2017-06-28 LAB — BASIC METABOLIC PANEL
Anion gap: 8 (ref 5–15)
BUN: 22 mg/dL — AB (ref 6–20)
CHLORIDE: 104 mmol/L (ref 101–111)
CO2: 25 mmol/L (ref 22–32)
Calcium: 9.6 mg/dL (ref 8.9–10.3)
Creatinine, Ser: 1.2 mg/dL (ref 0.61–1.24)
GFR calc Af Amer: >60 mL/min (ref >60)
GFR calc non Af Amer: >60 mL/min (ref >60)
Glucose, Bld: 93 mg/dL (ref 65–99)
POTASSIUM: 3.7 mmol/L (ref 3.5–5.1)
SODIUM: 137 mmol/L (ref 135–145)

## 2017-06-28 LAB — HEMOGLOBIN A1C
Hgb A1c MFr Bld: 5.1 % (ref 4.8–5.6)
Mean Plasma Glucose: 99.67 mg/dL

## 2017-06-28 NOTE — Progress Notes (Signed)
PCP - none Cardiologist - [place]  Chest x-ray - not needed EKG - 01/26/17 Stress Test - 2017 ECHO - denies Cardiac Cath - denies    Patient denies shortness of breath, fever, cough and chest pain at PAT appointment   Patient verbalized understanding of instructions that were given to them at the PAT appointment. Patient was also instructed that they will need to review over the PAT instructions again at home before surgery.

## 2017-06-29 LAB — CEA: CEA1: 1.5 ng/mL (ref 0.0–4.7)

## 2017-07-03 ENCOUNTER — Inpatient Hospital Stay (HOSPITAL_COMMUNITY): Payer: BC Managed Care – PPO | Admitting: Certified Registered Nurse Anesthetist

## 2017-07-03 ENCOUNTER — Encounter (HOSPITAL_COMMUNITY): Payer: Self-pay

## 2017-07-03 ENCOUNTER — Encounter (HOSPITAL_COMMUNITY)
Admission: RE | Disposition: A | Discharge status: Home / Self Care | Payer: Self-pay | Source: Ambulatory Visit | Attending: General Surgery

## 2017-07-03 ENCOUNTER — Inpatient Hospital Stay (HOSPITAL_COMMUNITY)
Admission: RE | Admit: 2017-07-03 | Discharge: 2017-07-08 | DRG: 330 | Disposition: A | Discharge status: Home / Self Care | Payer: BC Managed Care – PPO | Source: Ambulatory Visit | Attending: General Surgery | Admitting: General Surgery

## 2017-07-03 ENCOUNTER — Inpatient Hospital Stay (HOSPITAL_COMMUNITY): Payer: BC Managed Care – PPO | Admitting: Emergency Medicine

## 2017-07-03 DIAGNOSIS — Z86010 Personal history of colonic polyps: Secondary | ICD-10-CM | POA: Diagnosis not present

## 2017-07-03 DIAGNOSIS — E785 Hyperlipidemia, unspecified: Secondary | ICD-10-CM | POA: Diagnosis present

## 2017-07-03 DIAGNOSIS — D122 Benign neoplasm of ascending colon: Secondary | ICD-10-CM | POA: Diagnosis present

## 2017-07-03 DIAGNOSIS — Z87891 Personal history of nicotine dependence: Secondary | ICD-10-CM

## 2017-07-03 DIAGNOSIS — K567 Ileus, unspecified: Secondary | ICD-10-CM | POA: Diagnosis not present

## 2017-07-03 DIAGNOSIS — K635 Polyp of colon: Secondary | ICD-10-CM | POA: Diagnosis present

## 2017-07-03 DIAGNOSIS — I10 Essential (primary) hypertension: Secondary | ICD-10-CM | POA: Diagnosis present

## 2017-07-03 HISTORY — PX: LAPAROSCOPIC RIGHT COLECTOMY: SHX5925

## 2017-07-03 HISTORY — PX: COLECTOMY: SHX59

## 2017-07-03 HISTORY — DX: Polyp of colon: K63.5

## 2017-07-03 SURGERY — COLECTOMY, RIGHT, LAPAROSCOPIC
Anesthesia: General | Site: Abdomen | Laterality: Right

## 2017-07-03 MED ORDER — POLYETHYLENE GLYCOL 3350 17 GM/SCOOP PO POWD: 1.0000 | Freq: Once | ORAL | Status: DC

## 2017-07-03 MED ORDER — SODIUM CHLORIDE 0.9 % IR SOLN
Status: DC | PRN
  Administered 2017-07-03: 1000 mL

## 2017-07-03 MED ORDER — MORPHINE SULFATE (PF) 2 MG/ML IV SOLN
2.0000 mg | INTRAVENOUS | Status: DC | PRN
  Administered 2017-07-03: 2 mg via INTRAVENOUS
  Filled 2017-07-03: qty 1

## 2017-07-03 MED ORDER — LACTATED RINGERS IV SOLN
INTRAVENOUS | Status: DC | PRN
  Administered 2017-07-03 (×2): via INTRAVENOUS

## 2017-07-03 MED ORDER — LISINOPRIL-HYDROCHLOROTHIAZIDE 20-12.5 MG PO TABS: 1.0000 | ORAL_TABLET | Freq: Every day | ORAL | Status: DC

## 2017-07-03 MED ORDER — MIDAZOLAM HCL 2 MG/2ML IJ SOLN
INTRAMUSCULAR | Status: AC
  Filled 2017-07-03: qty 2

## 2017-07-03 MED ORDER — ALVIMOPAN 12 MG PO CAPS
ORAL_CAPSULE | ORAL | Status: AC
  Administered 2017-07-03: 12 mg via ORAL
  Filled 2017-07-03: qty 1

## 2017-07-03 MED ORDER — CEFOTETAN DISODIUM-DEXTROSE 2-2.08 GM-% IV SOLR
2.0000 g | INTRAVENOUS | Status: AC
  Administered 2017-07-03: 2 g via INTRAVENOUS

## 2017-07-03 MED ORDER — GABAPENTIN 300 MG PO CAPS
300.0000 mg | ORAL_CAPSULE | ORAL | Status: AC
  Administered 2017-07-03: 300 mg via ORAL

## 2017-07-03 MED ORDER — CHLORHEXIDINE GLUCONATE CLOTH 2 % EX PADS: 6.0000 | MEDICATED_PAD | Freq: Once | CUTANEOUS | Status: DC

## 2017-07-03 MED ORDER — LISINOPRIL 20 MG PO TABS
20.0000 mg | ORAL_TABLET | Freq: Every day | ORAL | Status: DC
  Administered 2017-07-04 – 2017-07-08 (×5): 20 mg via ORAL
  Filled 2017-07-03 (×6): qty 1

## 2017-07-03 MED ORDER — ACETAMINOPHEN 500 MG PO TABS
1000.0000 mg | ORAL_TABLET | Freq: Four times a day (QID) | ORAL | Status: AC
  Administered 2017-07-03 – 2017-07-04 (×3): 1000 mg via ORAL
  Filled 2017-07-03 (×4): qty 2

## 2017-07-03 MED ORDER — ENOXAPARIN SODIUM 40 MG/0.4ML ~~LOC~~ SOLN
40.0000 mg | SUBCUTANEOUS | Status: DC
  Administered 2017-07-04 – 2017-07-08 (×5): 40 mg via SUBCUTANEOUS
  Filled 2017-07-03 (×5): qty 0.4

## 2017-07-03 MED ORDER — MIDAZOLAM HCL 5 MG/5ML IJ SOLN
INTRAMUSCULAR | Status: DC | PRN
  Administered 2017-07-03: 2 mg via INTRAVENOUS

## 2017-07-03 MED ORDER — ALVIMOPAN 12 MG PO CAPS
12.0000 mg | ORAL_CAPSULE | ORAL | Status: AC
  Administered 2017-07-03: 12 mg via ORAL

## 2017-07-03 MED ORDER — BUPIVACAINE HCL (PF) 0.25 % IJ SOLN
INTRAMUSCULAR | Status: DC | PRN
  Administered 2017-07-03: 12 mL

## 2017-07-03 MED ORDER — MORPHINE SULFATE (PF) 4 MG/ML IV SOLN
INTRAVENOUS | Status: AC
  Filled 2017-07-03: qty 1

## 2017-07-03 MED ORDER — ONDANSETRON HCL 4 MG/2ML IJ SOLN
INTRAMUSCULAR | Status: AC
  Filled 2017-07-03: qty 2

## 2017-07-03 MED ORDER — SUGAMMADEX SODIUM 200 MG/2ML IV SOLN
INTRAVENOUS | Status: DC | PRN
  Administered 2017-07-03: 200 mg via INTRAVENOUS

## 2017-07-03 MED ORDER — SODIUM CHLORIDE 0.9 % IV SOLN
INTRAVENOUS | Status: DC
  Administered 2017-07-03 – 2017-07-04 (×2): via INTRAVENOUS

## 2017-07-03 MED ORDER — EPHEDRINE SULFATE 50 MG/ML IJ SOLN
INTRAMUSCULAR | Status: DC | PRN
  Administered 2017-07-03: 10 mg via INTRAVENOUS

## 2017-07-03 MED ORDER — PROPOFOL 10 MG/ML IV BOLUS
INTRAVENOUS | Status: AC
  Filled 2017-07-03: qty 20

## 2017-07-03 MED ORDER — FENTANYL CITRATE (PF) 100 MCG/2ML IJ SOLN
INTRAMUSCULAR | Status: AC
  Filled 2017-07-03: qty 2

## 2017-07-03 MED ORDER — HYDRALAZINE HCL 20 MG/ML IJ SOLN: 10.0000 mg | INTRAMUSCULAR | Status: DC | PRN

## 2017-07-03 MED ORDER — NEOMYCIN SULFATE 500 MG PO TABS: 1000.0000 mg | ORAL_TABLET | ORAL | Status: DC

## 2017-07-03 MED ORDER — FENTANYL CITRATE (PF) 100 MCG/2ML IJ SOLN
INTRAMUSCULAR | Status: DC | PRN
  Administered 2017-07-03: 25 ug via INTRAVENOUS
  Administered 2017-07-03: 100 ug via INTRAVENOUS
  Administered 2017-07-03 (×3): 50 ug via INTRAVENOUS
  Administered 2017-07-03: 100 ug via INTRAVENOUS
  Administered 2017-07-03: 50 ug via INTRAVENOUS

## 2017-07-03 MED ORDER — MORPHINE SULFATE (PF) 4 MG/ML IV SOLN
2.0000 mg | INTRAVENOUS | Status: DC | PRN
  Administered 2017-07-03 (×2): 2 mg via INTRAVENOUS
  Filled 2017-07-03 (×2): qty 1

## 2017-07-03 MED ORDER — AMLODIPINE BESYLATE 10 MG PO TABS
10.0000 mg | ORAL_TABLET | Freq: Every day | ORAL | Status: DC
  Administered 2017-07-04 – 2017-07-08 (×5): 10 mg via ORAL
  Filled 2017-07-03 (×5): qty 1

## 2017-07-03 MED ORDER — HYDROCHLOROTHIAZIDE 12.5 MG PO CAPS
12.5000 mg | ORAL_CAPSULE | Freq: Every day | ORAL | Status: DC
  Administered 2017-07-04 – 2017-07-08 (×5): 12.5 mg via ORAL
  Filled 2017-07-03 (×6): qty 1

## 2017-07-03 MED ORDER — ONDANSETRON HCL 4 MG/2ML IJ SOLN
4.0000 mg | Freq: Four times a day (QID) | INTRAMUSCULAR | Status: DC | PRN
  Administered 2017-07-05 – 2017-07-07 (×4): 4 mg via INTRAVENOUS
  Filled 2017-07-03 (×4): qty 2

## 2017-07-03 MED ORDER — ACETAMINOPHEN 500 MG PO TABS
1000.0000 mg | ORAL_TABLET | ORAL | Status: AC
  Administered 2017-07-03: 1000 mg via ORAL

## 2017-07-03 MED ORDER — DEXAMETHASONE SODIUM PHOSPHATE 10 MG/ML IJ SOLN
INTRAMUSCULAR | Status: DC | PRN
  Administered 2017-07-03: 10 mg via INTRAVENOUS

## 2017-07-03 MED ORDER — OXYCODONE HCL 5 MG PO TABS
10.0000 mg | ORAL_TABLET | ORAL | Status: DC | PRN
  Administered 2017-07-03 – 2017-07-08 (×10): 10 mg via ORAL
  Filled 2017-07-03 (×11): qty 2

## 2017-07-03 MED ORDER — HEPARIN SODIUM (PORCINE) 5000 UNIT/ML IJ SOLN
5000.0000 [IU] | Freq: Once | INTRAMUSCULAR | Status: AC
  Administered 2017-07-03: 5000 [IU] via SUBCUTANEOUS

## 2017-07-03 MED ORDER — ROCURONIUM BROMIDE 10 MG/ML (PF) SYRINGE
PREFILLED_SYRINGE | INTRAVENOUS | Status: AC
  Filled 2017-07-03: qty 5

## 2017-07-03 MED ORDER — METRONIDAZOLE 500 MG PO TABS: 1000.0000 mg | ORAL_TABLET | ORAL | Status: DC

## 2017-07-03 MED ORDER — FENTANYL CITRATE (PF) 100 MCG/2ML IJ SOLN
25.0000 ug | INTRAMUSCULAR | Status: DC | PRN
  Administered 2017-07-03 (×3): 50 ug via INTRAVENOUS

## 2017-07-03 MED ORDER — PROPOFOL 10 MG/ML IV BOLUS
INTRAVENOUS | Status: DC | PRN
  Administered 2017-07-03: 200 mg via INTRAVENOUS

## 2017-07-03 MED ORDER — METHOCARBAMOL 1000 MG/10ML IJ SOLN
500.0000 mg | Freq: Three times a day (TID) | INTRAVENOUS | Status: DC | PRN
  Filled 2017-07-03: qty 5

## 2017-07-03 MED ORDER — FENTANYL CITRATE (PF) 250 MCG/5ML IJ SOLN
INTRAMUSCULAR | Status: AC
  Filled 2017-07-03: qty 5

## 2017-07-03 MED ORDER — BUPIVACAINE HCL (PF) 0.25 % IJ SOLN
INTRAMUSCULAR | Status: AC
  Filled 2017-07-03: qty 30

## 2017-07-03 MED ORDER — ONDANSETRON HCL 4 MG PO TABS: 4.0000 mg | ORAL_TABLET | Freq: Four times a day (QID) | ORAL | Status: DC | PRN

## 2017-07-03 MED ORDER — ROCURONIUM BROMIDE 10 MG/ML (PF) SYRINGE
PREFILLED_SYRINGE | INTRAVENOUS | Status: DC | PRN
  Administered 2017-07-03: 50 mg via INTRAVENOUS

## 2017-07-03 MED ORDER — 0.9 % SODIUM CHLORIDE (POUR BTL) OPTIME
TOPICAL | Status: DC | PRN
  Administered 2017-07-03 (×3): 1000 mL

## 2017-07-03 MED ORDER — LIDOCAINE 2% (20 MG/ML) 5 ML SYRINGE
INTRAMUSCULAR | Status: DC | PRN
  Administered 2017-07-03: 100 mg via INTRAVENOUS

## 2017-07-03 MED ORDER — EPHEDRINE SULFATE 50 MG/ML IJ SOLN
INTRAMUSCULAR | Status: AC
  Filled 2017-07-03: qty 1

## 2017-07-03 MED ORDER — ONDANSETRON HCL 4 MG/2ML IJ SOLN
INTRAMUSCULAR | Status: DC | PRN
  Administered 2017-07-03: 4 mg via INTRAVENOUS

## 2017-07-03 MED ORDER — LIDOCAINE 2% (20 MG/ML) 5 ML SYRINGE
INTRAMUSCULAR | Status: AC
  Filled 2017-07-03: qty 5

## 2017-07-03 SURGICAL SUPPLY — 67 items
APPLIER CLIP 5 13 M/L LIGAMAX5 (MISCELLANEOUS)
APR CLP MED LRG 5 ANG JAW (MISCELLANEOUS)
BLADE CLIPPER SURG (BLADE) IMPLANT
CANISTER SUCT 3000ML PPV (MISCELLANEOUS) ×3 IMPLANT
CELLS DAT CNTRL 66122 CELL SVR (MISCELLANEOUS) ×1 IMPLANT
CHLORAPREP W/TINT 26ML (MISCELLANEOUS) ×3 IMPLANT
CLIP APPLIE 5 13 M/L LIGAMAX5 (MISCELLANEOUS) IMPLANT
COVER SURGICAL LIGHT HANDLE (MISCELLANEOUS) ×3 IMPLANT
DRAPE LAPAROSCOPIC ABDOMINAL (DRAPES) ×3 IMPLANT
DRAPE WARM FLUID 44X44 (DRAPE) ×3 IMPLANT
DRSG OPSITE POSTOP 4X6 (GAUZE/BANDAGES/DRESSINGS) ×2 IMPLANT
DRSG TEGADERM 2-3/8X2-3/4 SM (GAUZE/BANDAGES/DRESSINGS) ×9 IMPLANT
ELECT BLADE 6.5 EXT (BLADE) ×2 IMPLANT
ELECT CAUTERY BLADE 6.4 (BLADE) ×6 IMPLANT
ELECT REM PT RETURN 9FT ADLT (ELECTROSURGICAL) ×3
ELECTRODE REM PT RTRN 9FT ADLT (ELECTROSURGICAL) ×1 IMPLANT
GAUZE SPONGE 2X2 8PLY STRL LF (GAUZE/BANDAGES/DRESSINGS) IMPLANT
GAUZE SPONGE 4X4 12PLY STRL (GAUZE/BANDAGES/DRESSINGS) IMPLANT
GLOVE BIO SURGEON STRL SZ7 (GLOVE) ×6 IMPLANT
GLOVE BIOGEL PI IND STRL 7.5 (GLOVE) ×1 IMPLANT
GLOVE BIOGEL PI INDICATOR 7.5 (GLOVE) ×2
GOWN STRL REUS W/ TWL LRG LVL3 (GOWN DISPOSABLE) ×4 IMPLANT
GOWN STRL REUS W/TWL LRG LVL3 (GOWN DISPOSABLE) ×12
KIT BASIN OR (CUSTOM PROCEDURE TRAY) ×3 IMPLANT
KIT ROOM TURNOVER OR (KITS) ×3 IMPLANT
LIGASURE IMPACT 36 18CM CVD LR (INSTRUMENTS) ×2 IMPLANT
MARKER SKIN DUAL TIP RULER LAB (MISCELLANEOUS) ×2 IMPLANT
NS IRRIG 1000ML POUR BTL (IV SOLUTION) ×6 IMPLANT
PAD ARMBOARD 7.5X6 YLW CONV (MISCELLANEOUS) ×6 IMPLANT
PENCIL BUTTON HOLSTER BLD 10FT (ELECTRODE) ×3 IMPLANT
RELOAD PROXIMATE 75MM BLUE (ENDOMECHANICALS) ×6 IMPLANT
RETRACTOR WND ALEXIS 18 MED (MISCELLANEOUS) IMPLANT
RTRCTR WOUND ALEXIS 18CM MED (MISCELLANEOUS) ×3
SCISSORS LAP 5X35 DISP (ENDOMECHANICALS) IMPLANT
SET IRRIG TUBING LAPAROSCOPIC (IRRIGATION / IRRIGATOR) IMPLANT
SHEARS HARMONIC ACE PLUS 36CM (ENDOMECHANICALS) ×2 IMPLANT
SLEEVE ENDOPATH XCEL 5M (ENDOMECHANICALS) ×3 IMPLANT
SPECIMEN JAR LARGE (MISCELLANEOUS) ×3 IMPLANT
SPONGE GAUZE 2X2 STER 10/PKG (GAUZE/BANDAGES/DRESSINGS) ×2
SPONGE LAP 18X18 X RAY DECT (DISPOSABLE) IMPLANT
STAPLER GUN LINEAR PROX 60 (STAPLE) ×3 IMPLANT
STAPLER PROXIMATE 75MM BLUE (STAPLE) ×2 IMPLANT
STAPLER VISISTAT 35W (STAPLE) ×3 IMPLANT
SUCTION POOLE TIP (SUCTIONS) ×3 IMPLANT
SUT PDS AB 1 TP1 54 (SUTURE) ×4 IMPLANT
SUT PDS AB 1 TP1 96 (SUTURE) ×2 IMPLANT
SUT SILK 2 0 (SUTURE) ×3
SUT SILK 2 0 SH CR/8 (SUTURE) ×3 IMPLANT
SUT SILK 2-0 18XBRD TIE 12 (SUTURE) ×1 IMPLANT
SUT SILK 3 0 (SUTURE) ×3
SUT SILK 3 0 SH CR/8 (SUTURE) ×3 IMPLANT
SUT SILK 3-0 18XBRD TIE 12 (SUTURE) ×1 IMPLANT
SUT VIC AB 3-0 SH 8-18 (SUTURE) IMPLANT
SYS LAPSCP GELPORT 120MM (MISCELLANEOUS)
SYSTEM LAPSCP GELPORT 120MM (MISCELLANEOUS) IMPLANT
TOWEL OR 17X24 6PK STRL BLUE (TOWEL DISPOSABLE) ×3 IMPLANT
TOWEL OR 17X26 10 PK STRL BLUE (TOWEL DISPOSABLE) ×3 IMPLANT
TRAY FOLEY W/METER SILVER 14FR (SET/KITS/TRAYS/PACK) ×3 IMPLANT
TRAY LAPAROSCOPIC MC (CUSTOM PROCEDURE TRAY) ×3 IMPLANT
TROCAR XCEL BLUNT TIP 100MML (ENDOMECHANICALS) ×2 IMPLANT
TROCAR XCEL NON-BLD 11X100MML (ENDOMECHANICALS) ×3 IMPLANT
TROCAR XCEL NON-BLD 5MMX100MML (ENDOMECHANICALS) ×3 IMPLANT
TUBE CONNECTING 12'X1/4 (SUCTIONS) ×1
TUBE CONNECTING 12X1/4 (SUCTIONS) ×2 IMPLANT
TUBING INSUFFLATION (TUBING) ×5 IMPLANT
WATER STERILE IRR 1000ML POUR (IV SOLUTION) IMPLANT
YANKAUER SUCT BULB TIP NO VENT (SUCTIONS) ×6 IMPLANT

## 2017-07-03 NOTE — Interval H&P Note (Signed)
History and Physical Interval Note:  07/03/2017 7:17 AM  Joseph [name]  has presented today for surgery, with the diagnosis of RIGHT COLON POLYP  The various methods of treatment have been discussed with the patient and family. After consideration of risks, benefits and other options for treatment, the patient has consented to  Procedure(s) with comments: LAPAROSCOPIC ASSISTED RIGHT COLECTOMY (Right) - GENERAL AND TAP BLOCK as a surgical intervention .  The patient's history has been reviewed, patient examined, no change in status, stable for surgery.  I have reviewed the patient's chart and labs.  Questions were answered to the patient's satisfaction.     [name]

## 2017-07-03 NOTE — Progress Notes (Signed)
Pt sts he only took 2 Flagyl pills and 2 Neomycin pills that he was prescribed yesterday.

## 2017-07-03 NOTE — Transfer of Care (Signed)
Immediate Anesthesia Transfer of Care Note  Patient: Joseph [name]  Procedure(s) Performed: Procedure(s) with comments: LAPAROSCOPIC ASSISTED RIGHT COLECTOMY (Right) - GENERAL AND TAP BLOCK  Patient Location: PACU  Anesthesia Type:General  Level of Consciousness: awake and alert   Airway & Oxygen Therapy: Patient Spontanous Breathing and Patient connected to face mask oxygen  Post-op Assessment: Report given to RN and Post -op Vital signs reviewed and stable  Post vital signs: Reviewed and stable  Last Vitals:  Vitals:   07/03/17 0543  BP: (!) 136/91  Pulse: 76  Resp: 20  Temp: 36.6 C  SpO2: 98%    Last Pain:  Vitals:   07/03/17 0543  TempSrc: Oral      Patients Stated Pain Goal: 3 (42/35/36 1443)  Complications: No apparent anesthesia complications

## 2017-07-03 NOTE — Anesthesia Procedure Notes (Signed)
Procedure Name: Intubation Date/Time: 07/03/2017 7:40 AM Performed by: Garrison Columbus T Pre-anesthesia Checklist: Patient identified, Emergency Drugs available, Suction available and Patient being monitored Patient Re-evaluated:Patient Re-evaluated prior to induction Oxygen Delivery Method: Circle System Utilized Preoxygenation: Pre-oxygenation with 100% oxygen Induction Type: IV induction Ventilation: Mask ventilation without difficulty Grade View: Grade I Tube type: Oral Tube size: 7.5 mm Number of attempts: 1 Airway Equipment and Method: Stylet and Oral airway Placement Confirmation: ETT inserted through vocal cords under direct vision,  positive ETCO2 and breath sounds checked- equal and bilateral Secured at: 23 cm Tube secured with: Tape Dental Injury: Teeth and Oropharynx as per pre-operative assessment

## 2017-07-03 NOTE — Discharge Instructions (Signed)
[facility] LAPAROSCOPIC SURGERY: POST OP INSTRUCTIONS  Always review your discharge instruction sheet given to you by the facility where your surgery was performed. IF YOU HAVE DISABILITY OR FAMILY LEAVE FORMS, YOU MUST BRING THEM TO THE OFFICE FOR PROCESSING.   DO NOT GIVE THEM TO YOUR DOCTOR.  1. A prescription for pain medication may be given to you upon discharge.  Take your pain medication as prescribed, if needed.  If narcotic pain medicine is not needed, then you may take acetaminophen (Tylenol), naprosyn (Alleve), or ibuprofen (Advil) as needed. 2. Take your usually prescribed medications unless otherwise directed. 3. If you need a refill on your pain medication, please contact your pharmacy.  They will contact our office to request authorization. Prescriptions will not be filled after 5pm or on week-ends. 4. You should follow a light diet the first few days after arrival home, such as soup and crackers, etc.  Be sure to include lots of fluids daily. 5. Most patients will experience some swelling and bruising in the area of the incisions.  Ice packs will help.  Swelling and bruising can take several days to resolve.  6. It is common to experience some constipation if taking pain medication after surgery.  Increasing fluid intake and taking a stool softener (such as Colace) will usually help or prevent this problem from occurring.  A mild laxative (Milk of Magnesia or Miralax) should be taken according to package instructions if there are no bowel movements after 48 hours. 7. Unless discharge instructions indicate otherwise, you may remove your bandages 48 hours after surgery, and you may shower at that time.  You may have steri-strips (small skin tapes) in place directly over the incision.  These strips should be left on the skin for 7-10 days.  If your surgeon used skin glue on the incision, you may shower in 24 hours.  The glue will flake  off over the next 2-3 weeks.  Any sutures or staples will be removed at the office during your follow-up visit. 8. ACTIVITIES:  You may resume regular (light) daily activities beginning the next day--such as daily self-care, walking, climbing stairs--gradually increasing activities as tolerated.  You may have sexual intercourse when it is comfortable.  Refrain from any heavy lifting or straining until approved by your doctor. a. You may drive when you are no longer taking prescription pain medication, you can comfortably wear a seatbelt, and you can safely maneuver your car and apply brakes. b. RETURN TO WORK:  __________________________________________________________ 9. You should see your doctor in the office for a follow-up appointment approximately 2-3 weeks after your surgery.  Make sure that you call for this appointment within a day or two after you arrive home to insure a convenient appointment time. 10. OTHER INSTRUCTIONS: __________________________________________________________________________________________________________________________ __________________________________________________________________________________________________________________________ WHEN TO CALL YOUR DOCTOR: 1. Fever over 101.0 2. Inability to urinate 3. Continued bleeding from incision. 4. Increased pain, redness, or drainage from the incision. 5. Increasing abdominal pain  The clinic staff is available to answer your questions during regular business hours.  Please don't hesitate to call and ask to speak to one of the nurses for clinical concerns.  If you have a medical emergency, go to the nearest emergency room or call 911.  A surgeon from [facility] is always on call at the hospital. [address], [address], [place], [place]  [address] ? [address] [address], [place],    [address] [phone] ? [phone] ? [phone]   [phone] Web site: [email or web address]  

## 2017-07-03 NOTE — Progress Notes (Signed)
Patient arrived to unit from PACU. Report received from nurse. Patient alert and oriented. No complications at site. Dressing scant sanguinous drainage.

## 2017-07-03 NOTE — Op Note (Signed)
Preoperative diagnosis: Right colon polyp 2 unresectable by endoscopy Postoperative diagnosis: Same as above Procedure: Laparoscopic-assisted right colectomy Surgeon: Dr. Serita Grammes Asst.: Dr. Jackolyn Confer  anesthesia: Gen. Complications: None Drains: none Estimated blood loss: 50 mL Specimens: Right colon and terminal ileum to pathology Sponge count was correct 2 at completion Disposition to recovery in stable condition  Indications:this is a 56 year old male who underwent screening colonoscopy with numerous polyps that were found. He has had all of these polyps excised except for 2 in his right colon. These are tubulovillous adenomas with high-grade dysplasia that are not amenable to endoscopic removal. He was then referred for evaluation for right colectomy.  Procedure: After informed consent was obtained the patient was taken to the operating room. He was administered antibiotics. He was given subcutaneous heparin. He had SCDs in place. He was then placed under general anesthesia without complication. A Foley and orogastric tube were placed. He was then prepped and draped in the standard sterile surgical fashion. Surgical timeout was then performed.  I infiltrate Marcaine below the umbilicus. I made a vertical incision. He did have a very small umbilical hernia at this site. I then entered into the peritoneum bluntly. I placed a 0 Vicryl pursestring suture and a Hassan trocar. The abdomen was insufflated to 15 mmHg pressure. I then placed 2 additional 5 mm trocars in the suprapubic region and right lower quadrant. An additional 5 mm trocar was placed in the epigastrium. I then identified the terminal ileum and the cecum. He had a  very long appendix that was adherent to the sidewall. The harmonic scalpel was then used to release the lateral attachments of the appendix and released some of the appendiceal mesentery. I then released the white line of Toldt and rotated the colon medially  using the harmonic scalpel. I then released the hepatic flexure using the harmonic scalpel mobilizing the colon in its entirely deep to the midline position. There was ink from prior colonoscopies that was present in the pelvis as well as on the mesentery. Once I had this appropriately mobilized I made an 8 cm incision and inserted a wound protector. The colon and the small bowel all came out of this very easily. I divided the terminal ileum with the GIA stapler. I then used the Bovie in the LigaSure device in combination with multiple 2-0 silk sutures to release the colonic mesentery. I then chose a point on the transverse colon after releasing the omentum and divided the transverse colon with the GIA stapler preserving its blood supply. The colon was then removed in its entirety and passed off the table as a specimen. There was one small area of bleeding in the mesentery and it was oversewn with 2-0 silk suture. I then brought the terminal ileum in proximity to the transverse colon. I approximated these with 3-0 silk sutures. I made enterotomies in both. The GIA stapler was inserted and I created anastomosis. This was hemostatic. I closed the common defect with a TX stapler. I placed 2 3-0 silk sutures at the apex of this. I then closed the mesenteric defect with 2-0 silk suture. The anastomosis was patent and viable. I then placed the omentum overlying the anastomosis and returned this to the abdomen. We then followed the colon protocol. We irrigated and then re-scrubbed and draped. We got rid of all the dirty instruments at this point. I then used #1 looped PDS suture to close the fascia including the small umbilical hernia. This was then irrigated.  I went back in with the laparoscope and there was no evidence of any bleeding and the bowel also appear to be in the correct position. I then removed all my trocars and desufflated the abdomen. The incisions were closed with staples. Dressings were placed. He  tolerated this well was extubated and transferred to the recovery room in stable condition.

## 2017-07-03 NOTE — H&P (Signed)
Joseph [name] is an 56 y.o. male.   Chief Complaint: right colon polyp HPI:  63 yom I know from prior lih repair who had brbpr noted earlier this year. He does not have this anymore. he has undergone evaluation with colonoscopy that showed several polyps. 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 He also had small rectal polyps noted and removed. there were sigmoid polyps that were referred to [facility]. there he was noted to have two large cecal polyps that were not amenable to emr therapy with advanced endoscopy. biopsies were adenoma with focal hgd which according to note from gi is likely under-read. a large sigmoid polyp was removed. the note from Dr Arsenio Loader states he needs right colectomy but no other therapy as the remainder of the colon is cleared. the path on sigmoid poyp was tva with multifocal HGD but the stalk resection margin is negative. he was on plavix previously and is not anymore. he is here today to discuss surgery   Past Medical History:  Diagnosis Date  . Asthma    child  . Colon polyp   . DVT (deep venous thrombosis) ([place])   . Hyperlipidemia   . Hypertension   . Inguinal hernia    LIH  . Pilonidal cyst   . Rectal bleeding   . Tuberculosis    tb  dx 3-4    Past Surgical History:  Procedure Laterality Date  . COLONOSCOPY WITH PROPOFOL N/A 01/24/2017   Procedure: COLONOSCOPY WITH PROPOFOL;  Surgeon: Wilford Corner, MD;  Location: [facility];  Service: Endoscopy;  Laterality: N/A;  . FLEXIBLE SIGMOIDOSCOPY N/A 01/29/2017   Procedure: FLEXIBLE SIGMOIDOSCOPY;  Surgeon: Ronald Lobo, MD;  Location: [facility];  Service: Endoscopy;  Laterality: N/A;  . INGUINAL HERNIA REPAIR  09/07/2011   Procedure: HERNIA REPAIR INGUINAL ADULT;  Surgeon: Rolm Bookbinder, MD;  Location: [facility];  Service: General;  Laterality: Left;  . PERIPHERAL VASCULAR CATHETERIZATION N/A 07/25/2016   Procedure: Lower Extremity Angiography;  Surgeon: Adrian Prows, MD;  Location: [facility];  Service: Cardiovascular;  Laterality: N/A;  . PILONIDAL CYST EXCISION      History reviewed. No pertinent family history. Social History:  reports that he quit smoking about 13 months ago. He has a 7.50 pack-year smoking history. He has never used smokeless tobacco. He reports that he drinks alcohol. He reports that he does not use drugs.  Allergies: No Known Allergies  Medications Prior to Admission  Medication Sig Dispense Refill  . amLODipine (NORVASC) 10 MG tablet Take 10 mg by mouth daily.  1  . ibuprofen (ADVIL,MOTRIN) 200 MG tablet Take 200 mg by mouth daily as needed for mild pain.    Marland Kitchen lisinopril-hydrochlorothiazide (PRINZIDE,ZESTORETIC) 20-12.5 MG tablet TAKE 1 TABLET BY MOUTH DAILY. 90 tablet 0  . rosuvastatin (CRESTOR) 20 MG tablet Take 20 mg by mouth daily.  2    No results found for this or any previous visit (from the past 48 hour(s)). No results found.  Review of Systems  Gastrointestinal: Negative for abdominal pain.  All other systems reviewed and are negative.   Blood pressure (!) 136/91, pulse 76, temperature 97.9 F (36.6 C), temperature source Oral, resp. rate 20, height 5\' 11"  (1.803 m), weight 103.4 kg (228 lb), SpO2 98 %. Physical Exam  06/14/2017 9:29 AM Weight: 230.2 lb Height: 71in Body Surface Area: 2.24 m Body Mass Index: 32.11 kg/m  Temp.: 97.84F  Pulse: 61 (Regular)  BP:  130/80 (Sitting, Left Arm, Standard) Physical Exam General Mental Status-Alert. Orientation-Oriented X3. Chest and Lung Exam Chest and lung exam reveals -quiet, even and easy respiratory effort with no use of accessory muscles and on auscultation, normal breath sounds, no adventitious sounds and normal vocal resonance. Cardiovascular Cardiovascular examination reveals -normal heart sounds, regular rate and rhythm with no murmurs and no digital clubbing, cyanosis, edema, increased warmth or  tenderness. Abdomen Note: soft nt/nd small reducible umbo hernia no mass  Assessment & Plan Rolm Bookbinder MD; 06/14/2017 12:39 PM) POLYP OF ASCENDING COLON (D12.2) Story: Laparoscopic assisted right colectomy we discussed using a graphic a lap assisted right colectomy. we discussed indications to prevent cancer as well as possibility of already being present. risks include but not limited to bleeding, infection, leak requiring reoperation, abscess, wound infection, medical risks among others we discussed hospital stay and recovery in detail. will schedule soon  [facility], MD 07/03/2017, 7:15 AM

## 2017-07-03 NOTE — Anesthesia Postprocedure Evaluation (Signed)
Anesthesia Post Note  Patient: Joseph [name]  Procedure(s) Performed: Procedure(s) (LRB): LAPAROSCOPIC ASSISTED RIGHT COLECTOMY (Right)     Patient location during evaluation: PACU Anesthesia Type: General Level of consciousness: awake Pain management: pain level controlled Respiratory status: spontaneous breathing Cardiovascular status: stable Anesthetic complications: no    Last Vitals:  Vitals:   07/03/17 1100 07/03/17 1127  BP: 135/82   Pulse: 89 82  Resp: 12 14  Temp:    SpO2: 90% 100%    Last Pain:  Vitals:   07/03/17 1127  TempSrc:   PainSc: 10-Worst pain ever                 Kewana Sanon

## 2017-07-03 NOTE — Anesthesia Preprocedure Evaluation (Signed)
Anesthesia Evaluation  Patient identified by MRN, date of birth, ID band Patient awake    Reviewed: Allergy & Precautions, NPO status , Patient's Chart, lab work & pertinent test results  Airway Mallampati: II  TM Distance: >3 FB     Dental   Pulmonary asthma , former smoker,    breath sounds clear to auscultation       Cardiovascular hypertension, + Peripheral Vascular Disease   Rhythm:Regular Rate:Normal     Neuro/Psych    GI/Hepatic negative GI ROS, Neg liver ROS,   Endo/Other  negative endocrine ROS  Renal/GU negative Renal ROS     Musculoskeletal   Abdominal   Peds  Hematology  (+) anemia ,   Anesthesia Other Findings   Reproductive/Obstetrics                             Anesthesia Physical Anesthesia Plan  ASA: III  Anesthesia Plan: General   Post-op Pain Management:    Induction: Intravenous  PONV Risk Score and Plan: 2 and Ondansetron and Dexamethasone  Airway Management Planned:   Additional Equipment:   Intra-op Plan:   Post-operative Plan: Extubation in OR  Informed Consent: I have reviewed the patients History and Physical, chart, labs and discussed the procedure including the risks, benefits and alternatives for the proposed anesthesia with the patient or authorized representative who has indicated his/her understanding and acceptance.   Dental advisory given  Plan Discussed with: Anesthesiologist and CRNA  Anesthesia Plan Comments:         Anesthesia Quick Evaluation

## 2017-07-04 ENCOUNTER — Encounter (HOSPITAL_COMMUNITY): Payer: Self-pay | Admitting: General Surgery

## 2017-07-04 LAB — BASIC METABOLIC PANEL
ANION GAP: 8 (ref 5–15)
BUN: 12 mg/dL (ref 6–20)
CALCIUM: 9.3 mg/dL (ref 8.9–10.3)
CO2: 25 mmol/L (ref 22–32)
CREATININE: 1 mg/dL (ref 0.61–1.24)
Chloride: 99 mmol/L — ABNORMAL LOW (ref 101–111)
Glucose, Bld: 100 mg/dL — ABNORMAL HIGH (ref 65–99)
Potassium: 4.2 mmol/L (ref 3.5–5.1)
SODIUM: 132 mmol/L — AB (ref 135–145)

## 2017-07-04 LAB — CBC
HCT: 36.1 % — ABNORMAL LOW (ref 39.0–52.0)
HEMOGLOBIN: 11.4 g/dL — AB (ref 13.0–17.0)
MCH: 27.5 pg (ref 26.0–34.0)
MCHC: 31.6 g/dL (ref 30.0–36.0)
MCV: 87.2 fL (ref 78.0–100.0)
PLATELETS: 227 10*3/uL (ref 150–400)
RBC: 4.14 MIL/uL — AB (ref 4.22–5.81)
RDW: 15.2 % (ref 11.5–15.5)
WBC: 8.8 10*3/uL (ref 4.0–10.5)

## 2017-07-04 MED ORDER — KCL IN DEXTROSE-NACL 20-5-0.45 MEQ/L-%-% IV SOLN
INTRAVENOUS | Status: DC
  Administered 2017-07-04 – 2017-07-08 (×3): via INTRAVENOUS
  Filled 2017-07-04 (×5): qty 1000

## 2017-07-04 MED ORDER — ALVIMOPAN 12 MG PO CAPS
12.0000 mg | ORAL_CAPSULE | Freq: Two times a day (BID) | ORAL | Status: AC
  Administered 2017-07-04 – 2017-07-06 (×5): 12 mg via ORAL
  Filled 2017-07-04 (×5): qty 1

## 2017-07-04 NOTE — Progress Notes (Signed)
1 Day Post-Op   Subjective/Chief Complaint: Doing well, no emesis mild nausea yesterday, no flatus, pain controlled, foley out this am, has been up   Objective: Vital signs in last 24 hours: Temp:  [97 F (36.1 C)-98.4 F (36.9 C)] 98.3 F (36.8 C) (09/26 0517) Pulse Rate:  [71-93] 71 (09/26 0517) Resp:  [12-21] 18 (09/26 0517) BP: (130-155)/(76-94) 145/83 (09/26 0517) SpO2:  [89 %-100 %] 99 % (09/26 0517) Last BM Date: 07/02/17  Intake/Output from previous day: 09/25 0701 - 09/26 0700 In: 2788.8 [P.O.:120; I.V.:2668.8] Out: 1320 [Urine:1300; Blood:20] Intake/Output this shift: No intake/output data recorded.  Resp: clear to auscultation bilaterally Cardio: regular rate and rhythm GI: some bs present, dressing dry, soft approp tender  Lab Results:   Recent Labs  07/04/17 0418  WBC 8.8  HGB 11.4*  HCT 36.1*  PLT 227   BMET  Recent Labs  07/04/17 0418  NA 132*  K 4.2  CL 99*  CO2 25  GLUCOSE 100*  BUN 12  CREATININE 1.00  CALCIUM 9.3   PT/INR No results for input(s): LABPROT, INR in the last 72 hours. ABG No results for input(s): PHART, HCO3 in the last 72 hours.  Invalid input(s): PCO2, PO2  Studies/Results: No results found.  Anti-infectives: Anti-infectives    Start     Dose/Rate Route Frequency Ordered Stop   07/03/17 0612  cefoTEtan in Dextrose 5% (CEFOTAN) IVPB 2 g     2 g Intravenous On call to O.R. 07/03/17 0612 07/03/17 0816   07/03/17 0612  neomycin (MYCIFRADIN) tablet 1,000 mg  Status:  Discontinued     1,000 mg Oral 3 times per day 07/03/17 0612 07/03/17 0638   07/03/17 0612  metroNIDAZOLE (FLAGYL) tablet 1,000 mg  Status:  Discontinued     1,000 mg Oral 3 times per day 07/03/17 0612 07/03/17 5573      Assessment/Plan: POD 1 lap assist right colectomy  1. Continue iv and po pain control as needed 2. pulm toilet, oob, ics 3. Await ileus to resolve, continue clear liquids 4. Path pending 5. Dc foley today 6. lovenox  scds   Cortasia Screws 07/04/2017

## 2017-07-05 LAB — GLUCOSE, CAPILLARY: GLUCOSE-CAPILLARY: 102 mg/dL — AB (ref 65–99)

## 2017-07-05 LAB — CBC
HEMATOCRIT: 41.2 % (ref 39.0–52.0)
HEMOGLOBIN: 13.3 g/dL (ref 13.0–17.0)
MCH: 28.5 pg (ref 26.0–34.0)
MCHC: 32.3 g/dL (ref 30.0–36.0)
MCV: 88.2 fL (ref 78.0–100.0)
Platelets: 266 10*3/uL (ref 150–400)
RBC: 4.67 MIL/uL (ref 4.22–5.81)
RDW: 15.5 % (ref 11.5–15.5)
WBC: 9.3 10*3/uL (ref 4.0–10.5)

## 2017-07-05 LAB — BASIC METABOLIC PANEL
ANION GAP: 10 (ref 5–15)
BUN: 11 mg/dL (ref 6–20)
CHLORIDE: 97 mmol/L — AB (ref 101–111)
CO2: 27 mmol/L (ref 22–32)
Calcium: 10.2 mg/dL (ref 8.9–10.3)
Creatinine, Ser: 1.12 mg/dL (ref 0.61–1.24)
GFR calc Af Amer: >60 mL/min (ref >60)
GLUCOSE: 109 mg/dL — AB (ref 65–99)
POTASSIUM: 4.1 mmol/L (ref 3.5–5.1)
Sodium: 134 mmol/L — ABNORMAL LOW (ref 135–145)

## 2017-07-05 MED ORDER — SODIUM CHLORIDE 0.9 % IV BOLUS (SEPSIS)
500.0000 mL | Freq: Once | INTRAVENOUS | Status: AC
  Administered 2017-07-05: 500 mL via INTRAVENOUS

## 2017-07-05 MED ORDER — ACETAMINOPHEN 325 MG PO TABS: 650.0000 mg | ORAL_TABLET | Freq: Four times a day (QID) | ORAL | Status: DC | PRN

## 2017-07-05 NOTE — Progress Notes (Signed)
2 Days Post-Op   Subjective/Chief Complaint: Episode where he felt sweaty and nauseated, resolved, no flatus, no further nausea, no emesis, pain controlled now, has been up and around but was dizzy while nauseated   Objective: Vital signs in last 24 hours: Temp:  [98 F (36.7 C)-100 F (37.8 C)] 98 F (36.7 C) (09/27 0620) Pulse Rate:  [67-70] 67 (09/27 0620) Resp:  [16-20] 20 (09/27 0620) BP: (130-168)/(89-105) 130/105 (09/27 0620) SpO2:  [98 %-100 %] 100 % (09/26 2048) Last BM Date: 07/02/17  Intake/Output from previous day: 09/26 0701 - 09/27 0700 In: 560 [P.O.:360; I.V.:200] Out: 1000 [Urine:1000] Intake/Output this shift: No intake/output data recorded.  Lungs clear bilaterally cv rrr abd bs present wound clean approp tender   Lab Results:   Recent Labs  07/04/17 0418  WBC 8.8  HGB 11.4*  HCT 36.1*  PLT 227   BMET  Recent Labs  07/04/17 0418  NA 132*  K 4.2  CL 99*  CO2 25  GLUCOSE 100*  BUN 12  CREATININE 1.00  CALCIUM 9.3   PT/INR No results for input(s): LABPROT, INR in the last 72 hours. ABG No results for input(s): PHART, HCO3 in the last 72 hours.  Invalid input(s): PCO2, PO2  Studies/Results: No results found.  Anti-infectives: Anti-infectives    Start     Dose/Rate Route Frequency Ordered Stop   07/03/17 0612  cefoTEtan in Dextrose 5% (CEFOTAN) IVPB 2 g     2 g Intravenous On call to O.R. 07/03/17 0612 07/03/17 0816   07/03/17 0612  neomycin (MYCIFRADIN) tablet 1,000 mg  Status:  Discontinued     1,000 mg Oral 3 times per day 07/03/17 0612 07/03/17 0638   07/03/17 0612  metroNIDAZOLE (FLAGYL) tablet 1,000 mg  Status:  Discontinued     1,000 mg Oral 3 times per day 07/03/17 0612 07/03/17 5701      Assessment/Plan: POD 2 lap assist right colon  1. Continue iv and po pain meds, continue entereg 2. pulm toilet, ics 3. Continue clears until ileus resolves 4. Check labs given episode overnight 5. Discussed path today- all  benign 6. lovenox scds  Saudia Smyser 07/05/2017

## 2017-07-05 NOTE — Progress Notes (Signed)
Patient stated he was feeling hot, sweaty, nauseated, and dizzy. Obtained VS; BP elevated 130/105, HR 67. PRN IV Zofran administered. Patient reported 7/10 pain; offered pain medication but patient refused. Patient stated he would see how the zofran works for now and that he would like to try to rest. Will continue to monitor patient.

## 2017-07-06 NOTE — Progress Notes (Signed)
3 Days Post-Op   Subjective/Chief Complaint: Nausea resolved, had one episode emesis yesterday with broth but now taking clears fine, had a stool this am, ambulating, voiding   Objective: Vital signs in last 24 hours: Temp:  [98.4 F (36.9 C)-100.1 F (37.8 C)] 98.4 F (36.9 C) (09/28 0545) Pulse Rate:  [70-81] 73 (09/28 0545) Resp:  [18] 18 (09/28 0545) BP: (125-132)/(78-93) 125/78 (09/28 0545) SpO2:  [98 %-100 %] 98 % (09/28 0545) Last BM Date: 07/02/17  Intake/Output from previous day: 09/27 0701 - 09/28 0700 In: 3075 [P.O.:1080; I.V.:1995] Out: -  Intake/Output this shift: No intake/output data recorded.  Resp: clear to auscultation bilaterally Cardio: regular rate and rhythm GI: softer bs present incision clean without infection approp tender  Lab Results:   Recent Labs  07/04/17 0418 07/05/17 0749  WBC 8.8 9.3  HGB 11.4* 13.3  HCT 36.1* 41.2  PLT 227 266   BMET  Recent Labs  07/04/17 0418 07/05/17 0749  NA 132* 134*  K 4.2 4.1  CL 99* 97*  CO2 25 27  GLUCOSE 100* 109*  BUN 12 11  CREATININE 1.00 1.12  CALCIUM 9.3 10.2   PT/INR No results for input(s): LABPROT, INR in the last 72 hours. ABG No results for input(s): PHART, HCO3 in the last 72 hours.  Invalid input(s): PCO2, PO2  Studies/Results: No results found.  Anti-infectives: Anti-infectives    Start     Dose/Rate Route Frequency Ordered Stop   07/03/17 0612  cefoTEtan in Dextrose 5% (CEFOTAN) IVPB 2 g     2 g Intravenous On call to O.R. 07/03/17 0612 07/03/17 0816   07/03/17 0612  neomycin (MYCIFRADIN) tablet 1,000 mg  Status:  Discontinued     1,000 mg Oral 3 times per day 07/03/17 0612 07/03/17 0638   07/03/17 0612  metroNIDAZOLE (FLAGYL) tablet 1,000 mg  Status:  Discontinued     1,000 mg Oral 3 times per day 07/03/17 0612 07/03/17 5176      Assessment/Plan: POD 3 lap assist right colon  1. Continue iv and po pain meds, continue entereg 2. pulm toilet, ics 3. Will give  fulls today 4. Check bmet in am 5. Low grade fever yesterday, nl wbc and clinically well this am, will follow 6. lovenox scds   [facility] 07/06/2017

## 2017-07-07 LAB — BASIC METABOLIC PANEL
Anion gap: 11 (ref 5–15)
BUN: 29 mg/dL — AB (ref 6–20)
CO2: 26 mmol/L (ref 22–32)
CREATININE: 1.67 mg/dL — AB (ref 0.61–1.24)
Calcium: 9.8 mg/dL (ref 8.9–10.3)
Chloride: 96 mmol/L — ABNORMAL LOW (ref 101–111)
GFR calc Af Amer: 52 mL/min — ABNORMAL LOW (ref >60)
GFR, EST NON AFRICAN AMERICAN: 45 mL/min — AB (ref >60)
Glucose, Bld: 114 mg/dL — ABNORMAL HIGH (ref 65–99)
POTASSIUM: 4.1 mmol/L (ref 3.5–5.1)
SODIUM: 133 mmol/L — AB (ref 135–145)

## 2017-07-07 NOTE — Progress Notes (Signed)
4 Days Post-Op   Subjective/Chief Complaint: Feels ok. Small bm last night. Intermittent nausea   Objective: Vital signs in last 24 hours: Temp:  [98.5 F (36.9 C)-98.6 F (37 C)] 98.5 F (36.9 C) (09/29 0601) Pulse Rate:  [65-76] 65 (09/29 0601) Resp:  [18] 18 (09/29 0601) BP: (111-129)/(65-80) 111/75 (09/29 0601) SpO2:  [99 %-100 %] 99 % (09/29 0601) Last BM Date: 07/02/17  Intake/Output from previous day: 09/28 0701 - 09/29 0700 In: 1185 [P.O.:680; I.V.:505] Out: -  Intake/Output this shift: No intake/output data recorded.  General appearance: alert and cooperative Resp: clear to auscultation bilaterally Cardio: regular rate and rhythm GI: soft, minimal tenderness. slight distension. good bs. incision looks good  Lab Results:   Recent Labs  07/05/17 0749  WBC 9.3  HGB 13.3  HCT 41.2  PLT 266   BMET  Recent Labs  07/05/17 0749 07/07/17 0610  NA 134* 133*  K 4.1 4.1  CL 97* 96*  CO2 27 26  GLUCOSE 109* 114*  BUN 11 29*  CREATININE 1.12 1.67*  CALCIUM 10.2 9.8   PT/INR No results for input([name]): LABPROT, INR in the last 72 hours. ABG No results for input([name]): PHART, HCO3 in the last 72 hours.  Invalid input([name]): PCO2, PO2  Studies/Results: No results found.  Anti-infectives: Anti-infectives    Start     Dose/Rate Route Frequency Ordered Stop   07/03/17 0612  cefoTEtan in Dextrose 5% (CEFOTAN) IVPB 2 g     2 g Intravenous On call to O.R. 07/03/17 0612 07/03/17 0816   07/03/17 0612  neomycin (MYCIFRADIN) tablet 1,000 mg  Status:  Discontinued     1,000 mg Oral 3 times per day 07/03/17 0612 07/03/17 0638   07/03/17 0612  metroNIDAZOLE (FLAGYL) tablet 1,000 mg  Status:  Discontinued     1,000 mg Oral 3 times per day 07/03/17 0612 07/03/17 9169      Assessment/Plan: [name]/p Procedure([name]) with comments: LAPAROSCOPIC ASSISTED RIGHT COLECTOMY (Right) - GENERAL AND TAP BLOCK Advance diet. Start soft diet this am Ambulate POD 4  LOS: 4 days    TOTH  Joseph [name],Joseph [name] 07/07/2017

## 2017-07-08 MED ORDER — OXYCODONE-ACETAMINOPHEN 5-325 MG PO TABS: 1.0000 | ORAL_TABLET | ORAL | 0 refills | Status: DC | PRN

## 2017-07-08 NOTE — Care Management Note (Signed)
Case Management Note  Patient Details  Name: Joseph [name] MRN: [id number] Date of Birth: Apr 15, 1961  Subjective/Objective:   56 y.o. M s/p   Lap assisted R colectomy with No identified HH needs.                Action/Plan: Anticipate discharge home today. No further CM needs but will be available should additional discharge needs arise.   Expected Discharge Date:  07/08/17               Expected Discharge Plan:  Home/Self Care  In-House Referral:     Discharge planning Services  CM Consult  Post Acute Care Choice:    Choice offered to:     DME Arranged:    DME Agency:     HH Arranged:    HH Agency:     Status of Service:  Completed, signed off  If discussed at [employer] of Stay Meetings, dates discussed:    Additional Comments:  Delrae Sawyers, RN 07/08/2017, 10:14 AM

## 2017-07-08 NOTE — Progress Notes (Signed)
5 Days Post-Op   Subjective/Chief Complaint: Feels ok. Had another bm. No complaints   Objective: Vital signs in last 24 hours: Temp:  [97.8 F (36.6 C)-98.3 F (36.8 C)] 98.1 F (36.7 C) (09/30 0422) Pulse Rate:  [64-73] 64 (09/30 0422) Resp:  [17-18] 17 (09/30 0422) BP: (120-141)/(75-78) 120/76 (09/30 0422) SpO2:  [99 %-100 %] 99 % (09/30 0422) Last BM Date: 07/07/17  Intake/Output from previous day: 09/29 0701 - 09/30 0700 In: 2497.5 [P.O.:700; I.V.:1797.5] Out: -  Intake/Output this shift: No intake/output data recorded.  General appearance: alert and cooperative Resp: clear to auscultation bilaterally Cardio: regular rate and rhythm GI: soft, minimal tenderness. good bs. incision looks good  Lab Results:  No results for input([name]): WBC, HGB, HCT, PLT in the last 72 hours. BMET  Recent Labs  07/07/17 0610  NA 133*  K 4.1  CL 96*  CO2 26  GLUCOSE 114*  BUN 29*  CREATININE 1.67*  CALCIUM 9.8   PT/INR No results for input([name]): LABPROT, INR in the last 72 hours. ABG No results for input([name]): PHART, HCO3 in the last 72 hours.  Invalid input([name]): PCO2, PO2  Studies/Results: No results found.  Anti-infectives: Anti-infectives    Start     Dose/Rate Route Frequency Ordered Stop   07/03/17 0612  cefoTEtan in Dextrose 5% (CEFOTAN) IVPB 2 g     2 g Intravenous On call to O.R. 07/03/17 0612 07/03/17 0816   07/03/17 0612  neomycin (MYCIFRADIN) tablet 1,000 mg  Status:  Discontinued     1,000 mg Oral 3 times per day 07/03/17 0612 07/03/17 0638   07/03/17 0612  metroNIDAZOLE (FLAGYL) tablet 1,000 mg  Status:  Discontinued     1,000 mg Oral 3 times per day 07/03/17 0612 07/03/17 0638      Assessment/Plan: [name]/p Procedure([name]) with comments: LAPAROSCOPIC ASSISTED RIGHT COLECTOMY (Right) - GENERAL AND TAP BLOCK Advance diet Discharge  LOS: 5 days    Joseph [name] 07/08/2017

## 2017-07-10 NOTE — Discharge Summary (Signed)
Physician Discharge Summary  Patient ID: Joseph [name] MRN: [id number] DOB/AGE: 02-01-1961 56 y.o.  Admit date: 07/03/2017 Discharge date: 07/10/2017  Admission Diagnoses: Right colon polyps Hypertension  Discharge Diagnoses:  Active Problems:   Adenomatous polyp of ascending colon   Discharged Condition: good  Hospital Course: 56 yom with two right colon polyps with HGD unable to be removed endoscopically. He was referred and underwent lap right colectomy. Path is all benign. He had ileus which resolved and was tolerating regular diet.  His pain was controlled and he had bowel function. One day prior to dc his cr was elevated and not rechecked as he was discharged by one of my partners over the weekend.    Consults: None  Significant Diagnostic Studies: labs: mildly elevated Cr  Treatments: surgery: lap right colectomy    Disposition: 01-Home or Self Care  Discharge Instructions    Call MD for:  difficulty breathing, headache or visual disturbances    Complete by:  As directed    Call MD for:  extreme fatigue    Complete by:  As directed    Call MD for:  hives    Complete by:  As directed    Call MD for:  persistant dizziness or light-headedness    Complete by:  As directed    Call MD for:  persistant nausea and vomiting    Complete by:  As directed    Call MD for:  redness, tenderness, or signs of infection (pain, swelling, redness, odor or green/yellow discharge around incision site)    Complete by:  As directed    Call MD for:  severe uncontrolled pain    Complete by:  As directed    Call MD for:  temperature >100.4    Complete by:  As directed    Diet - low sodium heart healthy    Complete by:  As directed    Discharge instructions    Complete by:  As directed    May shower. No heavy lifting. Use colace and miralax for constipation   Increase activity slowly    Complete by:  As directed    No wound care    Complete by:  As directed      Allergies  as of 07/08/2017   No Known Allergies     Medication List    TAKE these medications   amLODipine 10 MG tablet Commonly known as:  NORVASC Take 10 mg by mouth daily.   ibuprofen 200 MG tablet Commonly known as:  ADVIL,MOTRIN Take 200 mg by mouth daily as needed for mild pain.   lisinopril-hydrochlorothiazide 20-12.5 MG tablet Commonly known as:  PRINZIDE,ZESTORETIC TAKE 1 TABLET BY MOUTH DAILY.   oxyCODONE-acetaminophen 5-325 MG tablet Commonly known as:  ROXICET Take 1-2 tablets by mouth every 4 (four) hours as needed for severe pain.   rosuvastatin 20 MG tablet Commonly known as:  CRESTOR Take 20 mg by mouth daily.      Follow-up Information    Rolm Bookbinder, MD Follow up in 2 week(s).   Specialty:  General Surgery Contact information: [address] [place] [address] [phone]          Will return this week for staple removal and will also get bmet checked.  SignedRolm Bookbinder 07/10/2017, 4:22 PM

## 2017-07-11 ENCOUNTER — Encounter (HOSPITAL_COMMUNITY): Payer: Self-pay

## 2017-07-11 ENCOUNTER — Emergency Department (HOSPITAL_COMMUNITY): Payer: BC Managed Care – PPO

## 2017-07-11 ENCOUNTER — Inpatient Hospital Stay (HOSPITAL_COMMUNITY)
Admission: EM | Admit: 2017-07-11 | Discharge: 2017-07-16 | DRG: 389 | Disposition: A | Discharge status: Home / Self Care | Payer: BC Managed Care – PPO | Attending: General Surgery | Admitting: General Surgery

## 2017-07-11 ENCOUNTER — Other Ambulatory Visit: Payer: Self-pay

## 2017-07-11 DIAGNOSIS — E785 Hyperlipidemia, unspecified: Secondary | ICD-10-CM | POA: Diagnosis present

## 2017-07-11 DIAGNOSIS — E871 Hypo-osmolality and hyponatremia: Secondary | ICD-10-CM | POA: Diagnosis present

## 2017-07-11 DIAGNOSIS — I10 Essential (primary) hypertension: Secondary | ICD-10-CM | POA: Diagnosis present

## 2017-07-11 DIAGNOSIS — K56609 Unspecified intestinal obstruction, unspecified as to partial versus complete obstruction: Secondary | ICD-10-CM | POA: Diagnosis present

## 2017-07-11 DIAGNOSIS — Z86010 Personal history of colonic polyps: Secondary | ICD-10-CM

## 2017-07-11 DIAGNOSIS — Z79899 Other long term (current) drug therapy: Secondary | ICD-10-CM

## 2017-07-11 DIAGNOSIS — Z86718 Personal history of other venous thrombosis and embolism: Secondary | ICD-10-CM | POA: Diagnosis not present

## 2017-07-11 DIAGNOSIS — K567 Ileus, unspecified: Secondary | ICD-10-CM | POA: Diagnosis present

## 2017-07-11 DIAGNOSIS — Z791 Long term (current) use of non-steroidal anti-inflammatories (NSAID): Secondary | ICD-10-CM

## 2017-07-11 DIAGNOSIS — R140 Abdominal distension (gaseous): Secondary | ICD-10-CM | POA: Diagnosis present

## 2017-07-11 DIAGNOSIS — Z87891 Personal history of nicotine dependence: Secondary | ICD-10-CM | POA: Diagnosis not present

## 2017-07-11 DIAGNOSIS — M549 Dorsalgia, unspecified: Secondary | ICD-10-CM | POA: Diagnosis present

## 2017-07-11 DIAGNOSIS — Z09 Encounter for follow-up examination after completed treatment for conditions other than malignant neoplasm: Secondary | ICD-10-CM

## 2017-07-11 DIAGNOSIS — Z79891 Long term (current) use of opiate analgesic: Secondary | ICD-10-CM

## 2017-07-11 DIAGNOSIS — K9189 Other postprocedural complications and disorders of digestive system: Secondary | ICD-10-CM

## 2017-07-11 DIAGNOSIS — Z9049 Acquired absence of other specified parts of digestive tract: Secondary | ICD-10-CM | POA: Diagnosis not present

## 2017-07-11 LAB — URINALYSIS, ROUTINE W REFLEX MICROSCOPIC
BACTERIA UA: NONE SEEN
Bilirubin Urine: NEGATIVE
Glucose, UA: NEGATIVE mg/dL
KETONES UR: 20 mg/dL — AB
Leukocytes, UA: NEGATIVE
Nitrite: NEGATIVE
PROTEIN: NEGATIVE mg/dL
Specific Gravity, Urine: >1.046 — ABNORMAL HIGH (ref 1.005–1.030)
pH: 5 (ref 5.0–8.0)

## 2017-07-11 LAB — COMPREHENSIVE METABOLIC PANEL
ALBUMIN: 3.9 g/dL (ref 3.5–5.0)
ALT: 16 U/L — ABNORMAL LOW (ref 17–63)
ANION GAP: 12 (ref 5–15)
AST: 17 U/L (ref 15–41)
Alkaline Phosphatase: 104 U/L (ref 38–126)
BILIRUBIN TOTAL: 0.9 mg/dL (ref 0.3–1.2)
BUN: 23 mg/dL — ABNORMAL HIGH (ref 6–20)
CO2: 25 mmol/L (ref 22–32)
Calcium: 10.2 mg/dL (ref 8.9–10.3)
Chloride: 96 mmol/L — ABNORMAL LOW (ref 101–111)
Creatinine, Ser: 1.31 mg/dL — ABNORMAL HIGH (ref 0.61–1.24)
GFR calc Af Amer: >60 mL/min (ref >60)
GFR calc non Af Amer: 60 mL/min — ABNORMAL LOW (ref >60)
Glucose, Bld: 106 mg/dL — ABNORMAL HIGH (ref 65–99)
POTASSIUM: 3.9 mmol/L (ref 3.5–5.1)
Sodium: 133 mmol/L — ABNORMAL LOW (ref 135–145)
TOTAL PROTEIN: 8.4 g/dL — AB (ref 6.5–8.1)

## 2017-07-11 LAB — CBC WITH DIFFERENTIAL/PLATELET
Basophils Absolute: 0 10*3/uL (ref 0.0–0.1)
Basophils Relative: 0 %
Eosinophils Absolute: 0 10*3/uL (ref 0.0–0.7)
Eosinophils Relative: 0 %
HEMATOCRIT: 37.8 % — AB (ref 39.0–52.0)
Hemoglobin: 12.5 g/dL — ABNORMAL LOW (ref 13.0–17.0)
LYMPHS PCT: 30 %
Lymphs Abs: 2.1 10*3/uL (ref 0.7–4.0)
MCH: 28.3 pg (ref 26.0–34.0)
MCHC: 33.1 g/dL (ref 30.0–36.0)
MCV: 85.7 fL (ref 78.0–100.0)
Monocytes Absolute: 0.3 10*3/uL (ref 0.1–1.0)
Monocytes Relative: 4 %
NEUTROS ABS: 4.7 10*3/uL (ref 1.7–7.7)
Neutrophils Relative %: 66 %
Platelets: 346 10*3/uL (ref 150–400)
RBC: 4.41 MIL/uL (ref 4.22–5.81)
RDW: 14.4 % (ref 11.5–15.5)
WBC: 7.1 10*3/uL (ref 4.0–10.5)

## 2017-07-11 LAB — I-STAT TROPONIN, ED: TROPONIN I, POC: 0 ng/mL (ref 0.00–0.08)

## 2017-07-11 LAB — I-STAT CG4 LACTIC ACID, ED: Lactic Acid, Venous: 1.83 mmol/L (ref 0.5–1.9)

## 2017-07-11 MED ORDER — SIMETHICONE 80 MG PO CHEW: 40.0000 mg | CHEWABLE_TABLET | Freq: Four times a day (QID) | ORAL | Status: DC | PRN

## 2017-07-11 MED ORDER — ENOXAPARIN SODIUM 40 MG/0.4ML ~~LOC~~ SOLN
40.0000 mg | SUBCUTANEOUS | Status: DC
  Administered 2017-07-11 – 2017-07-15 (×5): 40 mg via SUBCUTANEOUS
  Filled 2017-07-11 (×5): qty 0.4

## 2017-07-11 MED ORDER — MORPHINE SULFATE (PF) 4 MG/ML IV SOLN
1.0000 mg | INTRAVENOUS | Status: DC | PRN
  Administered 2017-07-11: 1 mg via INTRAVENOUS
  Filled 2017-07-11: qty 1

## 2017-07-11 MED ORDER — IOPAMIDOL (ISOVUE-370) INJECTION 76%
INTRAVENOUS | Status: AC
  Administered 2017-07-11: 100 mL
  Filled 2017-07-11: qty 100

## 2017-07-11 MED ORDER — ONDANSETRON HCL 4 MG/2ML IJ SOLN: 4.0000 mg | Freq: Once | INTRAMUSCULAR | Status: DC

## 2017-07-11 MED ORDER — ONDANSETRON 4 MG PO TBDP
4.0000 mg | ORAL_TABLET | Freq: Four times a day (QID) | ORAL | Status: DC | PRN
  Filled 2017-07-11: qty 1

## 2017-07-11 MED ORDER — ACETAMINOPHEN 650 MG RE SUPP: 650.0000 mg | Freq: Four times a day (QID) | RECTAL | Status: DC | PRN

## 2017-07-11 MED ORDER — SODIUM CHLORIDE 0.9 % IV SOLN: INTRAVENOUS | Status: DC

## 2017-07-11 MED ORDER — PROMETHAZINE HCL 25 MG/ML IJ SOLN
12.5000 mg | Freq: Once | INTRAMUSCULAR | Status: AC
  Administered 2017-07-11: 12.5 mg via INTRAVENOUS
  Filled 2017-07-11: qty 1

## 2017-07-11 MED ORDER — HYDRALAZINE HCL 20 MG/ML IJ SOLN: 20.0000 mg | INTRAMUSCULAR | Status: DC | PRN

## 2017-07-11 MED ORDER — ONDANSETRON HCL 4 MG/2ML IJ SOLN
4.0000 mg | Freq: Four times a day (QID) | INTRAMUSCULAR | Status: DC | PRN
  Administered 2017-07-11 – 2017-07-13 (×2): 4 mg via INTRAVENOUS
  Filled 2017-07-11 (×2): qty 2

## 2017-07-11 MED ORDER — SODIUM CHLORIDE 0.9 % IV SOLN
INTRAVENOUS | Status: DC
  Administered 2017-07-11 (×2): via INTRAVENOUS
  Administered 2017-07-12: 125 mL/h via INTRAVENOUS

## 2017-07-11 MED ORDER — ACETAMINOPHEN 325 MG PO TABS
650.0000 mg | ORAL_TABLET | Freq: Four times a day (QID) | ORAL | Status: DC | PRN
  Administered 2017-07-15: 650 mg via ORAL
  Filled 2017-07-11: qty 2

## 2017-07-11 MED ORDER — MORPHINE SULFATE (PF) 4 MG/ML IV SOLN
2.0000 mg | INTRAVENOUS | Status: DC | PRN
  Administered 2017-07-11 – 2017-07-13 (×3): 2 mg via INTRAVENOUS
  Filled 2017-07-11 (×3): qty 1

## 2017-07-11 MED ORDER — SODIUM CHLORIDE 0.9 % IV BOLUS (SEPSIS)
1000.0000 mL | Freq: Once | INTRAVENOUS | Status: AC
  Administered 2017-07-11: 1000 mL via INTRAVENOUS

## 2017-07-11 NOTE — ED Notes (Addendum)
Pt updated to room assignment. Will attempt report in 20 min per policy. Pt noted to have approx 150-200 ml of output from NG tube in suction cannister.

## 2017-07-11 NOTE — Consult Note (Signed)
[facility] Surgery Consult Note  Joseph [name] [birth year]  [id number].    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 Donne Hazel. He was discharged from the hospital 9/30 in good condition. Initially did well at home, then he states that he noticed clear/yellow drainage from incision on Monday. Denies erythema or purulent drainage. States that he has had chills but no fever. Abdomen is still sore, especially on the right side, but it is no worse than it was a few days ago. He does complain of nausea, abdominal distension, decreased appetite. No emesis. He is passing flatus and having 1-2 loose BM daily. He also reports experiencing intermittent SOB since discharge, today is the worst day. SOB is worse with exertion, but he also feels short of breath currently sitting in bed. Denies CP. Called our office this morning and was advise to come to the ED. Last meal 8am this morning (few bites of Corn Flakes)  PMH significant for HTN, HLD, h/o DVT Former smoker Employment: retired custodian   ROS: Review of Systems  Constitutional: Positive for chills.  HENT: Negative.   Eyes: Negative.   Respiratory: Positive for shortness of breath. Negative for cough.   Cardiovascular: Negative.   Gastrointestinal: Positive for abdominal pain and nausea. Negative for blood in stool, constipation, diarrhea, melena and vomiting.  Genitourinary: Negative.   Musculoskeletal: Negative.   Skin: Negative.        Drainage from abdominal wound  Neurological: Negative.   All systems reviewed and otherwise negative except for as above  No family history on file.  Past Medical History:  Diagnosis Date  . Asthma    child  . Colon polyp   . DVT (deep venous thrombosis) ([place])   . Hyperlipidemia   . Hypertension   .  Inguinal hernia    LIH  . Pilonidal cyst   . Rectal bleeding   . Tuberculosis    tb  dx 3-4    Past Surgical History:  Procedure Laterality Date  . COLECTOMY  07/03/2017   LAPROSCOPIC   . COLONOSCOPY WITH PROPOFOL N/A 01/24/2017   Procedure: COLONOSCOPY WITH PROPOFOL;  Surgeon: Wilford Corner, MD;  Location: [facility];  Service: Endoscopy;  Laterality: N/A;  . FLEXIBLE SIGMOIDOSCOPY N/A 01/29/2017   Procedure: FLEXIBLE SIGMOIDOSCOPY;  Surgeon: Ronald Lobo, MD;  Location: [facility];  Service: Endoscopy;  Laterality: N/A;  . INGUINAL HERNIA REPAIR  09/07/2011   Procedure: HERNIA REPAIR INGUINAL ADULT;  Surgeon: Rolm Bookbinder, MD;  Location: [facility];  Service: General;  Laterality: Left;  . LAPAROSCOPIC RIGHT COLECTOMY Right 07/03/2017   Procedure: LAPAROSCOPIC ASSISTED RIGHT COLECTOMY;  Surgeon: Rolm Bookbinder, MD;  Location: [facility];  Service: General;  Laterality: Right;  GENERAL AND TAP BLOCK  . PERIPHERAL VASCULAR CATHETERIZATION N/A 07/25/2016   Procedure: Lower Extremity Angiography;  Surgeon: Adrian Prows, MD;  Location: [facility];  Service: Cardiovascular;  Laterality: N/A;  . PILONIDAL CYST EXCISION      Social History:  reports that he quit smoking about 13 months ago. He has a 7.50 pack-year smoking history. He has never used smokeless tobacco. He reports that he drinks alcohol. He reports that he does not use drugs.  Allergies: No Known Allergies   (Not in a hospital admission)  Prior to Admission medications   Medication Sig Start Date End  Date Taking? Authorizing Provider  amLODipine (NORVASC) 10 MG tablet Take 10 mg by mouth daily. 06/21/16   [provider]  ibuprofen (ADVIL,MOTRIN) 200 MG tablet Take 200 mg by mouth daily as needed for mild pain.    [provider]  lisinopril-hydrochlorothiazide (PRINZIDE,ZESTORETIC) 20-12.5 MG tablet TAKE 1 TABLET BY MOUTH DAILY. 07/18/16   Ivar Drape D, PA  oxyCODONE-acetaminophen  (ROXICET) 5-325 MG tablet Take 1-2 tablets by mouth every 4 (four) hours as needed for severe pain. 07/08/17   Autumn Messing III, MD  rosuvastatin (CRESTOR) 20 MG tablet Take 20 mg by mouth daily. 01/07/17   [provider]    Blood pressure 119/87, pulse 76, temperature 99.5 F (37.5 C), temperature source Oral, resp. rate 16, SpO2 100 %. Physical Exam: General: pleasant, WD/WN AA male who is laying in bed in NAD HEENT: head is normocephalic, atraumatic.  Sclera are noninjected.  Pupils equal and round.  Ears and nose without any masses or lesions.  Mouth is pink and moist. Dentition fair Heart: regular, rate, and rhythm.  No obvious murmurs, gallops, or rubs noted.  Palpable pedal pulses bilaterally Lungs: CTAB, no wheezes, rhonchi, or rales noted.  Tachypneic. Abd: midline incision mostly cdi with staples intact/no surrounding erythema/trace serous drainage from distal aspect/no purulent drainage, soft, distended, +BS, no masses, hernias, or organomegaly. TTP RUQ and RLQ MS: all 4 extremities are symmetrical with no cyanosis, clubbing, or edema. No calf tenderness. Skin: warm and dry with no masses, lesions, or rashes Psych: A&Ox3 with an appropriate affect. Neuro: cranial nerves grossly intact, extremity CSM intact bilaterally, normal speech  Results for orders placed or performed during the hospital encounter of 07/11/17 (from the past 48 hour(s))  CBC with Differential     Status: Abnormal   Collection Time: 07/11/17 11:05 AM  Result Value Ref Range   WBC 7.1 4.0 - 10.5 K/uL   RBC 4.41 4.22 - 5.81 MIL/uL   Hemoglobin 12.5 (L) 13.0 - 17.0 g/dL   HCT 37.8 (L) 39.0 - 52.0 %   MCV 85.7 78.0 - 100.0 fL   MCH 28.3 26.0 - 34.0 pg   MCHC 33.1 30.0 - 36.0 g/dL   RDW 14.4 11.5 - 15.5 %   Platelets 346 150 - 400 K/uL   Neutrophils Relative % 66 %   Neutro Abs 4.7 1.7 - 7.7 K/uL   Lymphocytes Relative 30 %   Lymphs Abs 2.1 0.7 - 4.0 K/uL   Monocytes Relative 4 %   Monocytes Absolute  0.3 0.1 - 1.0 K/uL   Eosinophils Relative 0 %   Eosinophils Absolute 0.0 0.0 - 0.7 K/uL   Basophils Relative 0 %   Basophils Absolute 0.0 0.0 - 0.1 K/uL  I-Stat CG4 Lactic Acid, ED     Status: None   Collection Time: 07/11/17 11:17 AM  Result Value Ref Range   Lactic Acid, Venous 1.83 0.5 - 1.9 mmol/L   No results found.    Assessment/Plan HTN HLD H/o DVT 2017, no longer on anticoagulation  Right colon polyp 2 unresectable by endoscopy S/p Laparoscopic-assisted right colectomy 9/25 Dr. Donne Hazel - discharged 9/30 and returned to ED today with SOB and serous drainage from abdominal incision - POD8 - WBC 7.1, TMAX 99.5  Plan - Patient is going to undergo CT angio of the chest and CT scan abdomen/pelvis to evaluate for PE and postoperative complication in the abdomen. Will make recommendations following scans.  Wellington Hampshire, [facility] 07/11/2017, 11:30 AM  Pager: [phone] Consults: [phone] Mon-Fri 7:00 am-4:30 pm Sat-Sun 7:00 am-11:30 am

## 2017-07-11 NOTE — ED Triage Notes (Signed)
Pt presents to the ed with complaints of shortness of breath and leaking from his surgical site. Patient had appendix and colon surgery the end of September. Pt reports having leaking from his site since Sunday with upper abdominal pain and tightness in his abdomen. Pt also endorses feeling short of breath.  His GI doctor is concerned about him having a blood clot at his surgical site and he was sent here. Pt is in no distress in triage, mildly labored respirations.

## 2017-07-11 NOTE — ED Provider Notes (Signed)
[place] DEPT Provider Note   CSN: [id number] Arrival date & time: 07/11/17  1039     History   Chief Complaint Chief Complaint  Patient presents with  . Shortness of Breath  . Abdominal Pain    HPI Joseph [name] is a 56 y.o. male history of hypertension, hyperlipidemia, recent hemicolectomy with appendectomy on 9/25 here presenting with abdominal distention, shortness of breath. Patient was discharged from the hospital several days ago Joseph [name] progressive abdominal distention and swelling. Also has progressive shortness of breath as well. Patient states that he has some chills at home but denies actual fever. Patient states that he feels nauseated but denies any vomiting. He was noticed to have some clear drainage from his incision wounds but denies any purulent drainage from the incision. He still has some bowel movements. Patient states that he had a previous blood clot in his legs and is no longer on anticoagulation but denies any current leg pain or swelling.  The history is provided by the patient.    Past Medical History:  Diagnosis Date  . Asthma    child  . Colon polyp   . DVT (deep venous thrombosis) ([place])   . Hyperlipidemia   . Hypertension   . Inguinal hernia    LIH  . Pilonidal cyst   . Rectal bleeding   . Tuberculosis    tb  dx 3-4    Patient Active Problem List   Diagnosis Date Noted  . Adenomatous polyp of ascending colon 07/03/2017  . Adenomatous colon polyps 01/26/2017  . Polyp of cecum, probable cancer, with bleeding 01/26/2017  . Acute blood loss anemia 01/26/2017  . GI bleed 01/22/2017  . Atypical chest pain 07/01/2016  . Accelerated hypertension 07/01/2016  . Nonspecific abnormal electrocardiogram (ECG) (EKG) 07/01/2016  . Mild hyperlipidemia 07/01/2016  . Tobacco use disorder 07/01/2016  . Congenital hip dysplasia 07/01/2016  . Aortic atherosclerosis ([place]) 07/01/2016  . Claudication of left lower extremity ([place]) 07/01/2016  .  Inguinal hernia unilateral, non-recurrent 08/08/2011    Past Surgical History:  Procedure Laterality Date  . COLECTOMY  07/03/2017   LAPROSCOPIC   . COLONOSCOPY WITH PROPOFOL N/A 01/24/2017   Procedure: COLONOSCOPY WITH PROPOFOL;  Surgeon: Wilford Corner, MD;  Location: [facility];  Service: Endoscopy;  Laterality: N/A;  . FLEXIBLE SIGMOIDOSCOPY N/A 01/29/2017   Procedure: FLEXIBLE SIGMOIDOSCOPY;  Surgeon: Ronald Lobo, MD;  Location: [facility];  Service: Endoscopy;  Laterality: N/A;  . INGUINAL HERNIA REPAIR  09/07/2011   Procedure: HERNIA REPAIR INGUINAL ADULT;  Surgeon: Rolm Bookbinder, MD;  Location: [facility];  Service: General;  Laterality: Left;  . LAPAROSCOPIC RIGHT COLECTOMY Right 07/03/2017   Procedure: LAPAROSCOPIC ASSISTED RIGHT COLECTOMY;  Surgeon: Rolm Bookbinder, MD;  Location: [facility];  Service: General;  Laterality: Right;  GENERAL AND TAP BLOCK  . PERIPHERAL VASCULAR CATHETERIZATION N/A 07/25/2016   Procedure: Lower Extremity Angiography;  Surgeon: Adrian Prows, MD;  Location: [facility];  Service: Cardiovascular;  Laterality: N/A;  . PILONIDAL CYST EXCISION         Home Medications    Prior to Admission medications   Medication Sig Start Date End Date Taking? Authorizing Provider  amLODipine (NORVASC) 10 MG tablet Take 10 mg by mouth daily. 06/21/16   [provider]  ibuprofen (ADVIL,MOTRIN) 200 MG tablet Take 200 mg by mouth daily as needed for mild pain.    [provider]  lisinopril-hydrochlorothiazide (PRINZIDE,ZESTORETIC) 20-12.5 MG tablet TAKE 1 TABLET BY MOUTH DAILY. 07/18/16  Ivar Drape D, PA  oxyCODONE-acetaminophen (ROXICET) 5-325 MG tablet Take 1-2 tablets by mouth every 4 (four) hours as needed for severe pain. 07/08/17   Autumn Messing III, MD  rosuvastatin (CRESTOR) 20 MG tablet Take 20 mg by mouth daily. 01/07/17   [provider]    Family History No family history on file.  Social History Social History    Substance Use Topics  . Smoking status: Former Smoker    Packs/day: 0.50    Years: 15.00    Quit date: 05/22/2016  . Smokeless tobacco: Never Used  . Alcohol use Yes     Comment: 2 or 3 on weekend     Allergies   Patient has no known allergies.   Review of Systems Review of Systems  Respiratory: Positive for shortness of breath.   Gastrointestinal: Positive for abdominal pain.  All other systems reviewed and are negative.    Physical Exam Updated Vital Signs BP 126/89   Pulse 67   Temp 99.5 F (37.5 C) (Oral)   Resp 18   SpO2 96%   Physical Exam  Constitutional:  Chronically ill, tachypneic   HENT:  Head: Normocephalic.  Mouth/Throat: Oropharynx is clear and moist.  Eyes: Pupils are equal, round, and reactive to light. Conjunctivae and EOM are normal.  Neck: Normal range of motion. Neck supple.  Cardiovascular: Normal rate, regular rhythm and normal heart sounds.   Pulmonary/Chest: Effort normal.  Diminished bilateral bases   Abdominal:  Distended, midline incision scar with staples in place, clear drainage, no obvious purulent discharge or cellulitis. Mild diffuse tenderness, no rebound   Musculoskeletal: Normal range of motion. He exhibits no edema.  No calf tenderness or edema   Neurological: He is alert.  Skin: Skin is warm.  Psychiatric: He has a normal mood and affect.  Nursing note and vitals reviewed.    ED Treatments / Results  Labs (all labs ordered are listed, but only abnormal results are displayed) Labs Reviewed  COMPREHENSIVE METABOLIC PANEL - Abnormal; Notable for the following:       Result Value   Sodium 133 (*)    Chloride 96 (*)    Glucose, Bld 106 (*)    BUN 23 (*)    Creatinine, Ser 1.31 (*)    Total Protein 8.4 (*)    ALT 16 (*)    GFR calc non Af Amer 60 (*)    All other components within normal limits  CBC WITH DIFFERENTIAL/PLATELET - Abnormal; Notable for the following:    Hemoglobin 12.5 (*)    HCT 37.8 (*)    All  other components within normal limits  URINALYSIS, ROUTINE W REFLEX MICROSCOPIC  I-STAT CG4 LACTIC ACID, ED  I-STAT TROPONIN, ED    EKG  EKG Interpretation  Date/Time:  Wednesday July 11 2017 10:54:19 EDT Ventricular Rate:  79 PR Interval:  140 QRS Duration: 86 QT Interval:  414 QTC Calculation: 474 R Axis:   64 Text Interpretation:  Normal sinus rhythm Normal ECG No significant change since last tracing Confirmed by Wandra Arthurs [phone]) on 07/11/2017 11:05:54 AM Also confirmed by Wandra Arthurs [phone]), editor Drema Pry [phone])  on 07/11/2017 12:22:34 PM       Radiology Dg Chest 2 View  Result Date: 07/11/2017 CLINICAL DATA:  Colonic surgery last week. The patient porch shortness of breath and upper abdominal pain. EXAM: CHEST  2 VIEW COMPARISON:  Chest x-ray of August 25, 2011 FINDINGS: The lungs are well-expanded and clear.  There is no pleural effusion or pneumothorax. The heart and pulmonary vascularity are normal. The bony thorax is unremarkable. IMPRESSION: There is no active cardiopulmonary disease. Electronically Signed   By: David  Martinique M.D.   On: 07/11/2017 12:50   Ct Angio Chest Pe W Or Wo Contrast  Result Date: 07/11/2017 CLINICAL DATA:  Chest pain and history of DVT. Abdominal distention with pain following right colectomy performed 07/03/2017. EXAM: CT ANGIOGRAPHY CHEST CT ABDOMEN AND PELVIS WITH CONTRAST TECHNIQUE: Multidetector CT imaging of the chest was performed using the standard protocol during bolus administration of intravenous contrast. Multiplanar CT image reconstructions and MIPs were obtained to evaluate the vascular anatomy. Multidetector CT imaging of the abdomen and pelvis was performed using the standard protocol during bolus administration of intravenous contrast. CONTRAST:  100 mL Isovue 370 COMPARISON:  CT AP 05/26/2016 FINDINGS: CTA CHEST FINDINGS Cardiovascular: Contrast injection is sufficient to demonstrate satisfactory opacification of  the pulmonary arteries to the segmental level. There is no pulmonary embolus. The main pulmonary artery is within normal limits for size. There is no CT evidence of acute right heart strain. The visualized aorta is normal. There is a normal 3-vessel arch branching pattern. Heart size is normal, without pericardial effusion. Mediastinum/Nodes: No mediastinal, hilar or axillary lymphadenopathy. The visualized thyroid and thoracic esophageal course are unremarkable. Lungs/Pleura: 6 mm left perifissural nodule. Subsegmental bibasilar atelectasis. No pleural effusion. Musculoskeletal: No chest wall abnormality. No acute or significant osseous findings. Review of the MIP images confirms the above findings. CT ABDOMEN and PELVIS FINDINGS Hepatobiliary: Low-density focus in the left hepatic lobe is likely a hepatic cyst. The liver is otherwise normal. Normal gallbladder. Pancreas: Normal contours without ductal dilatation. No peripancreatic fluid collection. Spleen: Normal. Adrenals/Urinary Tract: --Adrenal glands: Normal. --Right kidney/ureter: No hydronephrosis or perinephric stranding. No nephrolithiasis. No obstructing ureteral stones. --Left kidney/ureter: No hydronephrosis or perinephric stranding. No nephrolithiasis. No obstructing ureteral stones. --Urinary bladder: Unremarkable. Stomach/Bowel: --Stomach/Duodenum: No hiatal hernia or other gastric abnormality. Normal duodenal course and caliber. --Small bowel: The distal small bowel is dilated to the level of the ileocolonic anastomosis. There is no inflammatory change or evidence of acute bowel ischemia. There is mild free fluid in the right lower quadrant. --Colon: Status post right hemicolectomy with lower mid abdominal ileocolonic anastomosis. No leak or fluid collection at the anastomotic site. --Appendix: Surgically absent. Vascular/Lymphatic: Normal course and caliber of the major abdominal vessels. No abdominal or pelvic lymphadenopathy. Reproductive: Normal  prostate and seminal vesicles. Musculoskeletal. Bilateral severe femorotibial osteoarthrosis, worse on the right. Extensive subchondral sclerosis and geodes formation within the femoral heads and acetabuli. Other: None. IMPRESSION: 1. No pulmonary embolus. 2. Dilated small bowel proximal to the ileocolonic anastomotic site, likely indicating obstruction at the anastomosis. No free intraperitoneal air or abscess. 3. Bilateral severe osteoarthrosis of the hips. 4. 6 mm nodule along the left major fissure is likely a intra ventral lymph node. Electronically Signed   By: Ulyses Jarred M.D.   On: 07/11/2017 14:14   Ct Abdomen Pelvis W Contrast  Result Date: 07/11/2017 CLINICAL DATA:  Chest pain and history of DVT. Abdominal distention with pain following right colectomy performed 07/03/2017. EXAM: CT ANGIOGRAPHY CHEST CT ABDOMEN AND PELVIS WITH CONTRAST TECHNIQUE: Multidetector CT imaging of the chest was performed using the standard protocol during bolus administration of intravenous contrast. Multiplanar CT image reconstructions and MIPs were obtained to evaluate the vascular anatomy. Multidetector CT imaging of the abdomen and pelvis was performed using the standard protocol during bolus  administration of intravenous contrast. CONTRAST:  100 mL Isovue 370 COMPARISON:  CT AP 05/26/2016 FINDINGS: CTA CHEST FINDINGS Cardiovascular: Contrast injection is sufficient to demonstrate satisfactory opacification of the pulmonary arteries to the segmental level. There is no pulmonary embolus. The main pulmonary artery is within normal limits for size. There is no CT evidence of acute right heart strain. The visualized aorta is normal. There is a normal 3-vessel arch branching pattern. Heart size is normal, without pericardial effusion. Mediastinum/Nodes: No mediastinal, hilar or axillary lymphadenopathy. The visualized thyroid and thoracic esophageal course are unremarkable. Lungs/Pleura: 6 mm left perifissural nodule.  Subsegmental bibasilar atelectasis. No pleural effusion. Musculoskeletal: No chest wall abnormality. No acute or significant osseous findings. Review of the MIP images confirms the above findings. CT ABDOMEN and PELVIS FINDINGS Hepatobiliary: Low-density focus in the left hepatic lobe is likely a hepatic cyst. The liver is otherwise normal. Normal gallbladder. Pancreas: Normal contours without ductal dilatation. No peripancreatic fluid collection. Spleen: Normal. Adrenals/Urinary Tract: --Adrenal glands: Normal. --Right kidney/ureter: No hydronephrosis or perinephric stranding. No nephrolithiasis. No obstructing ureteral stones. --Left kidney/ureter: No hydronephrosis or perinephric stranding. No nephrolithiasis. No obstructing ureteral stones. --Urinary bladder: Unremarkable. Stomach/Bowel: --Stomach/Duodenum: No hiatal hernia or other gastric abnormality. Normal duodenal course and caliber. --Small bowel: The distal small bowel is dilated to the level of the ileocolonic anastomosis. There is no inflammatory change or evidence of acute bowel ischemia. There is mild free fluid in the right lower quadrant. --Colon: Status post right hemicolectomy with lower mid abdominal ileocolonic anastomosis. No leak or fluid collection at the anastomotic site. --Appendix: Surgically absent. Vascular/Lymphatic: Normal course and caliber of the major abdominal vessels. No abdominal or pelvic lymphadenopathy. Reproductive: Normal prostate and seminal vesicles. Musculoskeletal. Bilateral severe femorotibial osteoarthrosis, worse on the right. Extensive subchondral sclerosis and geodes formation within the femoral heads and acetabuli. Other: None. IMPRESSION: 1. No pulmonary embolus. 2. Dilated small bowel proximal to the ileocolonic anastomotic site, likely indicating obstruction at the anastomosis. No free intraperitoneal air or abscess. 3. Bilateral severe osteoarthrosis of the hips. 4. 6 mm nodule along the left major fissure is  likely a intra ventral lymph node. Electronically Signed   By: Ulyses Jarred M.D.   On: 07/11/2017 14:14    Procedures Procedures (including critical care time)  Medications Ordered in ED Medications  sodium chloride 0.9 % bolus 1,000 mL (not administered)  ondansetron (ZOFRAN) injection 4 mg (not administered)  0.9 %  sodium chloride infusion (not administered)  iopamidol (ISOVUE-370) 76 % injection (100 mLs  Contrast Given 07/11/17 1342)     Initial Impression / Assessment and Plan / ED Course  I have reviewed the triage vital signs and the nursing notes.  Pertinent labs & imaging results that were available during my care of the patient were reviewed by me and considered in my medical decision making (see chart for details).    Joseph [name] is a 56 y.o. male here with SOB, abdominal distention s/p recent hemicolectomy. Consider ileus vs SBO vs PE. Will get labs, CT angio chest/ CT ab/pel. Surgical PA at bedside.   2:48 PM CT angio chest showed no PE. CT ab/pel showed SBO at the anastomosis site. Surgery to admit for SBO.    Final Clinical Impressions(s) / ED Diagnoses   Final diagnoses:  SBO (small bowel obstruction) ([place])    New Prescriptions New Prescriptions   No medications on file     Drenda Freeze, MD 07/11/17 1448

## 2017-07-11 NOTE — Progress Notes (Signed)
Patient ID: Joseph [name], male   DOB: 1961/09/16, 56 y.o.   MRN: [id number] Ct scans show no pe, dilated small bowel up to near anastomosis, no abscess/leak etc Will admit him with likely postop ileus, ng tube, hopefully resolve quickly

## 2017-07-11 NOTE — ED Notes (Signed)
Attempted report x1. 

## 2017-07-11 NOTE — ED Notes (Signed)
Pt dry heaving and reports nausea unrelieved by zofran administration. Admitting paged by Ina Kick. Pt given emesis bag.

## 2017-07-11 NOTE — Progress Notes (Signed)
Joseph [name] 07/11/2017 9:56 AM Location: [facility] Patient #: [id number] DOB: [birth year] Married / Language: English / Race: Black or African American Male  History of Present Illness  The patient is a 56 year old male presenting for a post-operative concern. He is status post right colectomy for right colon polyps  2 unresectable by endoscopy on 07/03/17 by Dr. Donne Hazel. He was discharged on 07/08/17 and at that time his creatinine was 1.67. 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 - HIGH GRADE DYSPLASIA IS NOT IDENTIFIED. - THERE IS NO EVIDENCE OF CARCINOMA IN 14 OF 14 LYMPH NODES (0/14) - THE SURGICAL RESECTION MARGINS ARE NEGATIVE FOR GLANDULAR DYSPLASIA. - SEE COMMENT.  He is presenting to urgent office today with complaints of thin, brown drainage coming from his incision which he noticed 2 days ago.  He states he has been very nauseated in the past week but he has only vomited twice with the last time being Thursday, 07/05/17. He has not taken anything for nausea. He admits to subjective fever and chills. He is complaining of moderate, constant, upper abdominal pain - he has been taking Percocet as needed, but has not been supplementing with Tylenol or Advil.   He admits to shortness of breath and getting easily winded since surgery. He also states he has back pain when he is sitting for long periods of time. He has had watery stools since surgery, about 1-2 times a day, and he states they are orange in color.   Allergies  No Known Drug Allergies 07/07/2016 Allergies Reconciled  Medication History  Neomycin Sulfate (500MG  Tablet, 2 (two) Tablet Oral SEE NOTE, Taken starting 06/14/2017) Active. (TAKE TWO TABLETS AT 2 PM, 3 PM, AND 10 PM THE DAY PRIOR TO SURGERY) MetroNIDAZOLE (500MG  Tablet, 2 (two) Tablet Oral see note, Taken starting 06/14/2017) Active. (Take  two tablets at 2pm, 3pm, and 10pm the day prior to surgery) Lisinopril-Hydrochlorothiazide (20-12.5MG  Tablet, Oral) Active. AmLODIPine Besylate (10MG  Tablet, Oral) Active. Rosuvastatin Calcium (10MG  Tablet, Oral) Active. Medications Reconciled  Vitals  07/11/2017 9:56 AM Weight: 217.2 lb Height: 71in Body Surface Area: 2.18 m Body Mass Index: 30.29 kg/m  Temp.: 97.11F  Pulse: 92 (Regular)  BP: 132/82 (Sitting, Left Arm, Standard)   Physical Exam  General Alert, oriented, anxious Appears to be in distress and short of breath  Abdomen Distended (although he states this is improved since the hospital) Severe diffuse tenderness Guarding present  Midline incision clean and intact with staples Small amount of dry drainage at inferior aspect of incision    Assessment & Plan   POSTOPERATIVE STATE [phone]) Impression: He is 8 days status post right colectomy by Dr. Donne Hazel. Based on his history and physical exam findings (distension, nausea, fevers, SOB, O2 sat in the 70s-80s), I asked Dr. Zella Richer to also evaluate the patient. He agrees that the patient needs to be evaluated in the ER to rule out postop complications such as pulmonary embolism or leak. He will be sent to the ER to have lab work and imaging. He and his wife provided verbal understanding. Dr. Donne Hazel is aware.  Carlena Hurl, [facility]

## 2017-07-11 NOTE — ED Notes (Signed)
Per Chrislyn, RN, pt up to the restroom but did not provide urine specimen. This RN notified pt of urine specimen order, pt verbalized understanding and reports he will notify staff when he has to void.

## 2017-07-12 ENCOUNTER — Inpatient Hospital Stay (HOSPITAL_COMMUNITY): Payer: BC Managed Care – PPO

## 2017-07-12 ENCOUNTER — Encounter (HOSPITAL_COMMUNITY): Payer: Self-pay | Admitting: General Practice

## 2017-07-12 LAB — BASIC METABOLIC PANEL
ANION GAP: 10 (ref 5–15)
BUN: 19 mg/dL (ref 6–20)
CALCIUM: 8.9 mg/dL (ref 8.9–10.3)
CO2: 25 mmol/L (ref 22–32)
Chloride: 100 mmol/L — ABNORMAL LOW (ref 101–111)
Creatinine, Ser: 1.11 mg/dL (ref 0.61–1.24)
Glucose, Bld: 97 mg/dL (ref 65–99)
Potassium: 4.1 mmol/L (ref 3.5–5.1)
Sodium: 135 mmol/L (ref 135–145)

## 2017-07-12 LAB — CBC
HCT: 33.2 % — ABNORMAL LOW (ref 39.0–52.0)
HEMOGLOBIN: 10.4 g/dL — AB (ref 13.0–17.0)
MCH: 27.5 pg (ref 26.0–34.0)
MCHC: 31.3 g/dL (ref 30.0–36.0)
MCV: 87.8 fL (ref 78.0–100.0)
Platelets: 273 10*3/uL (ref 150–400)
RBC: 3.78 MIL/uL — AB (ref 4.22–5.81)
RDW: 15.1 % (ref 11.5–15.5)
WBC: 4.3 10*3/uL (ref 4.0–10.5)

## 2017-07-12 MED ORDER — METOCLOPRAMIDE HCL 5 MG/ML IJ SOLN
5.0000 mg | Freq: Three times a day (TID) | INTRAMUSCULAR | Status: AC
  Administered 2017-07-12 – 2017-07-13 (×3): 5 mg via INTRAVENOUS
  Filled 2017-07-12 (×3): qty 2

## 2017-07-12 MED ORDER — DIATRIZOATE MEGLUMINE & SODIUM 66-10 % PO SOLN
90.0000 mL | Freq: Once | ORAL | Status: AC
  Administered 2017-07-12: 90 mL via NASOGASTRIC
  Filled 2017-07-12: qty 90

## 2017-07-12 MED ORDER — PHENOL 1.4 % MT LIQD
1.0000 | OROMUCOSAL | Status: DC | PRN
  Administered 2017-07-12: 1 via OROMUCOSAL
  Filled 2017-07-12: qty 177

## 2017-07-12 MED ORDER — KCL IN DEXTROSE-NACL 20-5-0.45 MEQ/L-%-% IV SOLN
INTRAVENOUS | Status: DC
Start: 2017-07-12 — End: 2017-07-14
  Administered 2017-07-12: 22:00:00 via INTRAVENOUS
  Administered 2017-07-12: 100 mL via INTRAVENOUS
  Filled 2017-07-12 (×3): qty 1000

## 2017-07-12 NOTE — H&P (Signed)
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 Donne Hazel. He was discharged from the hospital 9/30 in good condition. Initially did well at home, then he states that he noticed clear/yellow drainage from incision on Monday. Denies erythema or purulent drainage. States that he has had chills but no fever. Abdomen is still sore, especially on the right side, but it is no worse than it was a few days ago. He does complain of nausea, abdominal distension, decreased appetite. No emesis. He is passing flatus and having 1-2 loose BM daily. He also reports experiencing intermittent SOB since discharge, today is the worst day. SOB is worse with exertion, but he also feels short of breath currently sitting in bed. Denies CP. Called our office this morning and was advise to come to the ED. Last meal 8am this morning (few bites of Corn Flakes)  PMH significant for HTN, HLD, h/o DVT Former smoker Employment: retired custodian   ROS: Review of Systems  Constitutional: Positive for chills.  HENT: Negative.   Eyes: Negative.   Respiratory: Positive for shortness of breath. Negative for cough.   Cardiovascular: Negative.   Gastrointestinal: Positive for abdominal pain and nausea. Negative for blood in stool, constipation, diarrhea, melena and vomiting.  Genitourinary: Negative.   Musculoskeletal: Negative.   Skin: Negative.        Drainage from abdominal wound  Neurological: Negative.   All systems reviewed and otherwise negative except for as above  No family history on file.      Past Medical History:  Diagnosis Date  . Asthma    child  . Colon polyp   . DVT (deep venous thrombosis) ([place])   . Hyperlipidemia   . Hypertension   . Inguinal hernia    LIH  . Pilonidal cyst   . Rectal bleeding   .  Tuberculosis    tb  dx 3-4         Past Surgical History:  Procedure Laterality Date  . COLECTOMY  07/03/2017   LAPROSCOPIC   . COLONOSCOPY WITH PROPOFOL N/A 01/24/2017   Procedure: COLONOSCOPY WITH PROPOFOL;  Surgeon: Wilford Corner, MD;  Location: [facility];  Service: Endoscopy;  Laterality: N/A;  . FLEXIBLE SIGMOIDOSCOPY N/A 01/29/2017   Procedure: FLEXIBLE SIGMOIDOSCOPY;  Surgeon: Ronald Lobo, MD;  Location: [facility];  Service: Endoscopy;  Laterality: N/A;  . INGUINAL HERNIA REPAIR  09/07/2011   Procedure: HERNIA REPAIR INGUINAL ADULT;  Surgeon: Rolm Bookbinder, MD;  Location: [place];  Service: General;  Laterality: Left;  . LAPAROSCOPIC RIGHT COLECTOMY Right 07/03/2017   Procedure: LAPAROSCOPIC ASSISTED RIGHT COLECTOMY;  Surgeon: Rolm Bookbinder, MD;  Location: [place];  Service: General;  Laterality: Right;  GENERAL AND TAP BLOCK  . PERIPHERAL VASCULAR CATHETERIZATION N/A 07/25/2016   Procedure: Lower Extremity Angiography;  Surgeon: Adrian Prows, MD;  Location: [facility];  Service: Cardiovascular;  Laterality: N/A;  . PILONIDAL CYST EXCISION      Social History:  reports that he quit smoking about 13 months ago. He has a 7.50 pack-year smoking history. He has never used smokeless tobacco. He reports that he drinks alcohol. He reports that he does not use drugs.  Allergies: No Known Allergies   (Not in a hospital admission)         Prior to Admission medications   Medication Sig Start Date  End Date Taking? Authorizing Provider  amLODipine (NORVASC) 10 MG tablet Take 10 mg by mouth daily. 06/21/16   [provider]  ibuprofen (ADVIL,MOTRIN) 200 MG tablet Take 200 mg by mouth daily as needed for mild pain.    [provider]  lisinopril-hydrochlorothiazide (PRINZIDE,ZESTORETIC) 20-12.5 MG tablet TAKE 1 TABLET BY MOUTH DAILY. 07/18/16   Ivar Drape D, PA  oxyCODONE-acetaminophen (ROXICET) 5-325 MG tablet Take  1-2 tablets by mouth every 4 (four) hours as needed for severe pain. 07/08/17   Autumn Messing III, MD  rosuvastatin (CRESTOR) 20 MG tablet Take 20 mg by mouth daily. 01/07/17   [provider]    Blood pressure 119/87, pulse 76, temperature 99.5 F (37.5 C), temperature source Oral, resp. rate 16, SpO2 100 %. Physical Exam: General: pleasant, WD/WN AA male who is laying in bed in NAD HEENT: head is normocephalic, atraumatic.  Sclera are noninjected.  Pupils equal and round.  Ears and nose without any masses or lesions.  Mouth is pink and moist. Dentition fair Heart: regular, rate, and rhythm.  No obvious murmurs, gallops, or rubs noted.  Palpable pedal pulses bilaterally Lungs: CTAB, no wheezes, rhonchi, or rales noted.  Tachypneic. Abd: midline incision mostly cdi with staples intact/no surrounding erythema/trace serous drainage from distal aspect/no purulent drainage, soft, distended, +BS, no masses, hernias, or organomegaly. TTP RUQ and RLQ MS: all 4 extremities are symmetrical with no cyanosis, clubbing, or edema. No calf tenderness. Skin: warm and dry with no masses, lesions, or rashes Psych: A&Ox3 with an appropriate affect. Neuro: cranial nerves grossly intact, extremity CSM intact bilaterally, normal speech  Lab Results Last 48 Hours       Results for orders placed or performed during the hospital encounter of 07/11/17 (from the past 48 hour(s))  CBC with Differential     Status: Abnormal   Collection Time: 07/11/17 11:05 AM  Result Value Ref Range   WBC 7.1 4.0 - 10.5 K/uL   RBC 4.41 4.22 - 5.81 MIL/uL   Hemoglobin 12.5 (L) 13.0 - 17.0 g/dL   HCT 37.8 (L) 39.0 - 52.0 %   MCV 85.7 78.0 - 100.0 fL   MCH 28.3 26.0 - 34.0 pg   MCHC 33.1 30.0 - 36.0 g/dL   RDW 14.4 11.5 - 15.5 %   Platelets 346 150 - 400 K/uL   Neutrophils Relative % 66 %   Neutro Abs 4.7 1.7 - 7.7 K/uL   Lymphocytes Relative 30 %   Lymphs Abs 2.1 0.7 - 4.0 K/uL   Monocytes Relative 4  %   Monocytes Absolute 0.3 0.1 - 1.0 K/uL   Eosinophils Relative 0 %   Eosinophils Absolute 0.0 0.0 - 0.7 K/uL   Basophils Relative 0 %   Basophils Absolute 0.0 0.0 - 0.1 K/uL  I-Stat CG4 Lactic Acid, ED     Status: None   Collection Time: 07/11/17 11:17 AM  Result Value Ref Range   Lactic Acid, Venous 1.83 0.5 - 1.9 mmol/L     Imaging Results (Last 48 hours)  No results found.      Assessment/Plan HTN HLD H/o DVT 2017, no longer on anticoagulation  Right colon polyp 2 unresectable by endoscopy S/p Laparoscopic-assisted right colectomy 9/25 Dr. Donne Hazel - discharged 9/30 and returned to ED today with SOB and serous drainage from abdominal incision - POD8 - WBC 7.1, TMAX 99.5  Plan - Patient is going to undergo CT angio of the chest and CT scan abdomen/pelvis to evaluate for PE  and postoperative complication in the abdomen. Will make recommendations following scans.  Wellington Hampshire, [facility] Surgery 07/11/2017, 11:30 AM Pager: [phone] Consults: [phone] Mon-Fri 7:00 am-4:30 pm Sat-Sun 7:00 am-11:30 am

## 2017-07-12 NOTE — Progress Notes (Signed)
Patient ID: Joseph [name], male   DOB: 14-Aug-1961, 56 y.o.   MRN: [id number]  [facility] Surgery Progress Note     Subjective: CC- abdominal distension SOB has resolved. Patient states that his abdomen is still sore, a little better than yesterday. Pain mostly on the right side. Passing flatus this morning. Last BM was yesterday.   Objective: Vital signs in last 24 hours: Temp:  [97.9 F (36.6 C)-99.5 F (37.5 C)] 98.6 F (37 C) (10/04 0757) Pulse Rate:  [67-80] 75 (10/04 0757) Resp:  [11-20] 16 (10/04 0757) BP: (119-145)/(79-98) 123/85 (10/04 0757) SpO2:  [92 %-100 %] 92 % (10/04 0757) Last BM Date: 07/10/17  Intake/Output from previous day: 10/03 0701 - 10/04 0700 In: 1000 [IV Piggyback:1000] Out: -  Intake/Output this shift: No intake/output data recorded.  PE: Gen:  Alert, NAD, pleasant HEENT: EOM's intact, pupils equal and round Card:  RRR, no M/G/R heard Pulm:  CTAB, no W/R/R, effort normal Abd: Soft, distended, +BS, mild right sided TTP, midline incision mostly cdi except very distal aspect which has trace serosanguinous drainage >> staples have been removed Ext:  No erythema, edema, or tenderness BUE/BLE  Psych: A&Ox3  Skin: no rashes noted, warm and dry  Lab Results:   Recent Labs  07/11/17 1105 07/12/17 0450  WBC 7.1 4.3  HGB 12.5* 10.4*  HCT 37.8* 33.2*  PLT 346 273   BMET  Recent Labs  07/11/17 1105 07/12/17 0450  NA 133* 135  K 3.9 4.1  CL 96* 100*  CO2 25 25  GLUCOSE 106* 97  BUN 23* 19  CREATININE 1.31* 1.11  CALCIUM 10.2 8.9   PT/INR No results for input(s): LABPROT, INR in the last 72 hours. CMP     Component Value Date/Time   NA 135 07/12/2017 0450   K 4.1 07/12/2017 0450   CL 100 (L) 07/12/2017 0450   CO2 25 07/12/2017 0450   GLUCOSE 97 07/12/2017 0450   BUN 19 07/12/2017 0450   CREATININE 1.11 07/12/2017 0450   CREATININE 1.12 06/16/2016 1823   CALCIUM 8.9 07/12/2017 0450   PROT 8.4 (H) 07/11/2017 1105    ALBUMIN 3.9 07/11/2017 1105   AST 17 07/11/2017 1105   ALT 16 (L) 07/11/2017 1105   ALKPHOS 104 07/11/2017 1105   BILITOT 0.9 07/11/2017 1105   GFRNONAA >60 07/12/2017 0450   GFRNONAA 74 06/16/2016 1823   GFRAA >60 07/12/2017 0450   GFRAA 86 06/16/2016 1823   Lipase  No results found for: LIPASE     Studies/Results: Dg Chest 2 View  Result Date: 07/11/2017 CLINICAL DATA:  Colonic surgery last week. The patient porch shortness of breath and upper abdominal pain. EXAM: CHEST  2 VIEW COMPARISON:  Chest x-ray of August 25, 2011 FINDINGS: The lungs are well-expanded and clear. There is no pleural effusion or pneumothorax. The heart and pulmonary vascularity are normal. The bony thorax is unremarkable. IMPRESSION: There is no active cardiopulmonary disease. Electronically Signed   By: David  Martinique M.D.   On: 07/11/2017 12:50   Ct Angio Chest Pe W Or Wo Contrast  Result Date: 07/11/2017 CLINICAL DATA:  Chest pain and history of DVT. Abdominal distention with pain following right colectomy performed 07/03/2017. EXAM: CT ANGIOGRAPHY CHEST CT ABDOMEN AND PELVIS WITH CONTRAST TECHNIQUE: Multidetector CT imaging of the chest was performed using the standard protocol during bolus administration of intravenous contrast. Multiplanar CT image reconstructions and MIPs were obtained to evaluate the vascular anatomy. Multidetector CT imaging of the  abdomen and pelvis was performed using the standard protocol during bolus administration of intravenous contrast. CONTRAST:  100 mL Isovue 370 COMPARISON:  CT AP 05/26/2016 FINDINGS: CTA CHEST FINDINGS Cardiovascular: Contrast injection is sufficient to demonstrate satisfactory opacification of the pulmonary arteries to the segmental level. There is no pulmonary embolus. The main pulmonary artery is within normal limits for size. There is no CT evidence of acute right heart strain. The visualized aorta is normal. There is a normal 3-vessel arch branching pattern.  Heart size is normal, without pericardial effusion. Mediastinum/Nodes: No mediastinal, hilar or axillary lymphadenopathy. The visualized thyroid and thoracic esophageal course are unremarkable. Lungs/Pleura: 6 mm left perifissural nodule. Subsegmental bibasilar atelectasis. No pleural effusion. Musculoskeletal: No chest wall abnormality. No acute or significant osseous findings. Review of the MIP images confirms the above findings. CT ABDOMEN and PELVIS FINDINGS Hepatobiliary: Low-density focus in the left hepatic lobe is likely a hepatic cyst. The liver is otherwise normal. Normal gallbladder. Pancreas: Normal contours without ductal dilatation. No peripancreatic fluid collection. Spleen: Normal. Adrenals/Urinary Tract: --Adrenal glands: Normal. --Right kidney/ureter: No hydronephrosis or perinephric stranding. No nephrolithiasis. No obstructing ureteral stones. --Left kidney/ureter: No hydronephrosis or perinephric stranding. No nephrolithiasis. No obstructing ureteral stones. --Urinary bladder: Unremarkable. Stomach/Bowel: --Stomach/Duodenum: No hiatal hernia or other gastric abnormality. Normal duodenal course and caliber. --Small bowel: The distal small bowel is dilated to the level of the ileocolonic anastomosis. There is no inflammatory change or evidence of acute bowel ischemia. There is mild free fluid in the right lower quadrant. --Colon: Status post right hemicolectomy with lower mid abdominal ileocolonic anastomosis. No leak or fluid collection at the anastomotic site. --Appendix: Surgically absent. Vascular/Lymphatic: Normal course and caliber of the major abdominal vessels. No abdominal or pelvic lymphadenopathy. Reproductive: Normal prostate and seminal vesicles. Musculoskeletal. Bilateral severe femorotibial osteoarthrosis, worse on the right. Extensive subchondral sclerosis and geodes formation within the femoral heads and acetabuli. Other: None. IMPRESSION: 1. No pulmonary embolus. 2. Dilated small  bowel proximal to the ileocolonic anastomotic site, likely indicating obstruction at the anastomosis. No free intraperitoneal air or abscess. 3. Bilateral severe osteoarthrosis of the hips. 4. 6 mm nodule along the left major fissure is likely a intra ventral lymph node. Electronically Signed   By: Ulyses Jarred M.D.   On: 07/11/2017 14:14   Ct Abdomen Pelvis W Contrast  Result Date: 07/11/2017 CLINICAL DATA:  Chest pain and history of DVT. Abdominal distention with pain following right colectomy performed 07/03/2017. EXAM: CT ANGIOGRAPHY CHEST CT ABDOMEN AND PELVIS WITH CONTRAST TECHNIQUE: Multidetector CT imaging of the chest was performed using the standard protocol during bolus administration of intravenous contrast. Multiplanar CT image reconstructions and MIPs were obtained to evaluate the vascular anatomy. Multidetector CT imaging of the abdomen and pelvis was performed using the standard protocol during bolus administration of intravenous contrast. CONTRAST:  100 mL Isovue 370 COMPARISON:  CT AP 05/26/2016 FINDINGS: CTA CHEST FINDINGS Cardiovascular: Contrast injection is sufficient to demonstrate satisfactory opacification of the pulmonary arteries to the segmental level. There is no pulmonary embolus. The main pulmonary artery is within normal limits for size. There is no CT evidence of acute right heart strain. The visualized aorta is normal. There is a normal 3-vessel arch branching pattern. Heart size is normal, without pericardial effusion. Mediastinum/Nodes: No mediastinal, hilar or axillary lymphadenopathy. The visualized thyroid and thoracic esophageal course are unremarkable. Lungs/Pleura: 6 mm left perifissural nodule. Subsegmental bibasilar atelectasis. No pleural effusion. Musculoskeletal: No chest wall abnormality. No acute or significant osseous  findings. Review of the MIP images confirms the above findings. CT ABDOMEN and PELVIS FINDINGS Hepatobiliary: Low-density focus in the left  hepatic lobe is likely a hepatic cyst. The liver is otherwise normal. Normal gallbladder. Pancreas: Normal contours without ductal dilatation. No peripancreatic fluid collection. Spleen: Normal. Adrenals/Urinary Tract: --Adrenal glands: Normal. --Right kidney/ureter: No hydronephrosis or perinephric stranding. No nephrolithiasis. No obstructing ureteral stones. --Left kidney/ureter: No hydronephrosis or perinephric stranding. No nephrolithiasis. No obstructing ureteral stones. --Urinary bladder: Unremarkable. Stomach/Bowel: --Stomach/Duodenum: No hiatal hernia or other gastric abnormality. Normal duodenal course and caliber. --Small bowel: The distal small bowel is dilated to the level of the ileocolonic anastomosis. There is no inflammatory change or evidence of acute bowel ischemia. There is mild free fluid in the right lower quadrant. --Colon: Status post right hemicolectomy with lower mid abdominal ileocolonic anastomosis. No leak or fluid collection at the anastomotic site. --Appendix: Surgically absent. Vascular/Lymphatic: Normal course and caliber of the major abdominal vessels. No abdominal or pelvic lymphadenopathy. Reproductive: Normal prostate and seminal vesicles. Musculoskeletal. Bilateral severe femorotibial osteoarthrosis, worse on the right. Extensive subchondral sclerosis and geodes formation within the femoral heads and acetabuli. Other: None. IMPRESSION: 1. No pulmonary embolus. 2. Dilated small bowel proximal to the ileocolonic anastomotic site, likely indicating obstruction at the anastomosis. No free intraperitoneal air or abscess. 3. Bilateral severe osteoarthrosis of the hips. 4. 6 mm nodule along the left major fissure is likely a intra ventral lymph node. Electronically Signed   By: Ulyses Jarred M.D.   On: 07/11/2017 14:14    Anti-infectives: Anti-infectives    None       Assessment/Plan HTN HLD H/o DVT 2017, no longer on anticoagulation  Right colon polyp 2 unresectable by  endoscopy S/p Laparoscopic-assisted right colectomy 9/25 Dr. Donne Hazel - discharged 9/30 and returned to ED today with SOB and serous drainage from abdominal incision - CTA negative for PE - CT abdomen/pelvis showed dilated small bowel proximal to the ileocolonic anastomotic site - WBC 4.3, afebrile - NG tube with 150cc output - XR today shows continued dilated abdominal small bowel loops, mildly improved gas in the descending colon since the CT yesterday  ID - none FEN - IVF, NPO/NG tube VTE - SCDs, lovenox Foley - none Follow up - TBD  Plan - Abdominal XR shows persistently dilated small bowel loops. Continue NG tube to LIWS for now. Ok to clamp NG tube for OOB/ambulation. Xray and labs in AM.   LOS: 1 day    Wellington Hampshire , [facility] Surgery 07/12/2017, 9:55 AM Pager: [phone] Consults: [phone] Mon-Fri 7:00 am-4:30 pm Sat-Sun 7:00 am-11:30 am

## 2017-07-13 ENCOUNTER — Inpatient Hospital Stay (HOSPITAL_COMMUNITY): Payer: BC Managed Care – PPO

## 2017-07-13 LAB — CBC
HEMATOCRIT: 34.1 % — AB (ref 39.0–52.0)
HEMOGLOBIN: 10.9 g/dL — AB (ref 13.0–17.0)
MCH: 27.8 pg (ref 26.0–34.0)
MCHC: 32 g/dL (ref 30.0–36.0)
MCV: 87 fL (ref 78.0–100.0)
Platelets: 292 10*3/uL (ref 150–400)
RBC: 3.92 MIL/uL — AB (ref 4.22–5.81)
RDW: 14.6 % (ref 11.5–15.5)
WBC: 5.4 10*3/uL (ref 4.0–10.5)

## 2017-07-13 LAB — BASIC METABOLIC PANEL
Anion gap: 6 (ref 5–15)
BUN: 16 mg/dL (ref 6–20)
CHLORIDE: 100 mmol/L — AB (ref 101–111)
CO2: 25 mmol/L (ref 22–32)
CREATININE: 1.08 mg/dL (ref 0.61–1.24)
Calcium: 9 mg/dL (ref 8.9–10.3)
GFR calc Af Amer: >60 mL/min (ref >60)
GFR calc non Af Amer: >60 mL/min (ref >60)
Glucose, Bld: 122 mg/dL — ABNORMAL HIGH (ref 65–99)
POTASSIUM: 3.9 mmol/L (ref 3.5–5.1)
Sodium: 131 mmol/L — ABNORMAL LOW (ref 135–145)

## 2017-07-13 MED ORDER — POLYETHYLENE GLYCOL 3350 17 G PO PACK
17.0000 g | PACK | Freq: Every day | ORAL | Status: DC
  Administered 2017-07-14 – 2017-07-16 (×2): 17 g via ORAL
  Filled 2017-07-13 (×3): qty 1

## 2017-07-13 NOTE — Progress Notes (Signed)
Patient ID: Joseph [name], male   DOB: Sep 17, 1961, 56 y.o.   MRN: [id number]  [facility] Surgery Progress Note     Subjective: CC- abdominal distension Patient states that his abdominal distension is less than yesterday. Denies n/v. He has had 2 good BMs this morning and is passing gas. Already ambulated in the halls this AM.  Objective: Vital signs in last 24 hours: Temp:  [98.6 F (37 C)-99 F (37.2 C)] 98.9 F (37.2 C) (10/04 2148) Pulse Rate:  [75-82] 81 (10/04 2148) Resp:  [16-17] 17 (10/04 2148) BP: (123-144)/(85-97) 133/97 (10/04 2148) SpO2:  [92 %-96 %] 96 % (10/04 2148) Last BM Date: 07/10/17  Intake/Output from previous day: 10/04 0701 - 10/05 0700 In: 1468.3 [I.V.:1468.3] Out: 100 [Emesis/NG output:100] Intake/Output this shift: No intake/output data recorded.  PE: Gen:  Alert, NAD, pleasant HEENT: EOM's intact, pupils equal and round Card:  RRR, no M/G/R heard Pulm:  CTAB, no W/R/R, effort normal Abd: Soft, distended (improved from yesterday), +BS, mild right sided TTP, midline incision cdi with dried serosanguinous fluid at distal aspect/ no fluid expressed with palpation of wound Ext:  No erythema, edema, or tenderness BUE/BLE  Psych: A&Ox3  Skin: no rashes noted, warm and dry  Lab Results:   Recent Labs  07/12/17 0450 07/13/17 0542  WBC 4.3 5.4  HGB 10.4* 10.9*  HCT 33.2* 34.1*  PLT 273 292   BMET  Recent Labs  07/12/17 0450 07/13/17 0542  NA 135 131*  K 4.1 3.9  CL 100* 100*  CO2 25 25  GLUCOSE 97 122*  BUN 19 16  CREATININE 1.11 1.08  CALCIUM 8.9 9.0   PT/INR No results for input(s): LABPROT, INR in the last 72 hours. CMP     Component Value Date/Time   NA 131 (L) 07/13/2017 0542   K 3.9 07/13/2017 0542   CL 100 (L) 07/13/2017 0542   CO2 25 07/13/2017 0542   GLUCOSE 122 (H) 07/13/2017 0542   BUN 16 07/13/2017 0542   CREATININE 1.08 07/13/2017 0542   CREATININE 1.12 06/16/2016 1823   CALCIUM 9.0 07/13/2017 0542    PROT 8.4 (H) 07/11/2017 1105   ALBUMIN 3.9 07/11/2017 1105   AST 17 07/11/2017 1105   ALT 16 (L) 07/11/2017 1105   ALKPHOS 104 07/11/2017 1105   BILITOT 0.9 07/11/2017 1105   GFRNONAA >60 07/13/2017 0542   GFRNONAA 74 06/16/2016 1823   GFRAA >60 07/13/2017 0542   GFRAA 86 06/16/2016 1823   Lipase  No results found for: LIPASE     Studies/Results: Dg Chest 2 View  Result Date: 07/11/2017 CLINICAL DATA:  Colonic surgery last week. The patient porch shortness of breath and upper abdominal pain. EXAM: CHEST  2 VIEW COMPARISON:  Chest x-ray of August 25, 2011 FINDINGS: The lungs are well-expanded and clear. There is no pleural effusion or pneumothorax. The heart and pulmonary vascularity are normal. The bony thorax is unremarkable. IMPRESSION: There is no active cardiopulmonary disease. Electronically Signed   By: David  Martinique M.D.   On: 07/11/2017 12:50   Ct Angio Chest Pe W Or Wo Contrast  Result Date: 07/11/2017 CLINICAL DATA:  Chest pain and history of DVT. Abdominal distention with pain following right colectomy performed 07/03/2017. EXAM: CT ANGIOGRAPHY CHEST CT ABDOMEN AND PELVIS WITH CONTRAST TECHNIQUE: Multidetector CT imaging of the chest was performed using the standard protocol during bolus administration of intravenous contrast. Multiplanar CT image reconstructions and MIPs were obtained to evaluate the vascular anatomy. Multidetector  CT imaging of the abdomen and pelvis was performed using the standard protocol during bolus administration of intravenous contrast. CONTRAST:  100 mL Isovue 370 COMPARISON:  CT AP 05/26/2016 FINDINGS: CTA CHEST FINDINGS Cardiovascular: Contrast injection is sufficient to demonstrate satisfactory opacification of the pulmonary arteries to the segmental level. There is no pulmonary embolus. The main pulmonary artery is within normal limits for size. There is no CT evidence of acute right heart strain. The visualized aorta is normal. There is a normal  3-vessel arch branching pattern. Heart size is normal, without pericardial effusion. Mediastinum/Nodes: No mediastinal, hilar or axillary lymphadenopathy. The visualized thyroid and thoracic esophageal course are unremarkable. Lungs/Pleura: 6 mm left perifissural nodule. Subsegmental bibasilar atelectasis. No pleural effusion. Musculoskeletal: No chest wall abnormality. No acute or significant osseous findings. Review of the MIP images confirms the above findings. CT ABDOMEN and PELVIS FINDINGS Hepatobiliary: Low-density focus in the left hepatic lobe is likely a hepatic cyst. The liver is otherwise normal. Normal gallbladder. Pancreas: Normal contours without ductal dilatation. No peripancreatic fluid collection. Spleen: Normal. Adrenals/Urinary Tract: --Adrenal glands: Normal. --Right kidney/ureter: No hydronephrosis or perinephric stranding. No nephrolithiasis. No obstructing ureteral stones. --Left kidney/ureter: No hydronephrosis or perinephric stranding. No nephrolithiasis. No obstructing ureteral stones. --Urinary bladder: Unremarkable. Stomach/Bowel: --Stomach/Duodenum: No hiatal hernia or other gastric abnormality. Normal duodenal course and caliber. --Small bowel: The distal small bowel is dilated to the level of the ileocolonic anastomosis. There is no inflammatory change or evidence of acute bowel ischemia. There is mild free fluid in the right lower quadrant. --Colon: Status post right hemicolectomy with lower mid abdominal ileocolonic anastomosis. No leak or fluid collection at the anastomotic site. --Appendix: Surgically absent. Vascular/Lymphatic: Normal course and caliber of the major abdominal vessels. No abdominal or pelvic lymphadenopathy. Reproductive: Normal prostate and seminal vesicles. Musculoskeletal. Bilateral severe femorotibial osteoarthrosis, worse on the right. Extensive subchondral sclerosis and geodes formation within the femoral heads and acetabuli. Other: None. IMPRESSION: 1. No  pulmonary embolus. 2. Dilated small bowel proximal to the ileocolonic anastomotic site, likely indicating obstruction at the anastomosis. No free intraperitoneal air or abscess. 3. Bilateral severe osteoarthrosis of the hips. 4. 6 mm nodule along the left major fissure is likely a intra ventral lymph node. Electronically Signed   By: Ulyses Jarred M.D.   On: 07/11/2017 14:14   Ct Abdomen Pelvis W Contrast  Result Date: 07/11/2017 CLINICAL DATA:  Chest pain and history of DVT. Abdominal distention with pain following right colectomy performed 07/03/2017. EXAM: CT ANGIOGRAPHY CHEST CT ABDOMEN AND PELVIS WITH CONTRAST TECHNIQUE: Multidetector CT imaging of the chest was performed using the standard protocol during bolus administration of intravenous contrast. Multiplanar CT image reconstructions and MIPs were obtained to evaluate the vascular anatomy. Multidetector CT imaging of the abdomen and pelvis was performed using the standard protocol during bolus administration of intravenous contrast. CONTRAST:  100 mL Isovue 370 COMPARISON:  CT AP 05/26/2016 FINDINGS: CTA CHEST FINDINGS Cardiovascular: Contrast injection is sufficient to demonstrate satisfactory opacification of the pulmonary arteries to the segmental level. There is no pulmonary embolus. The main pulmonary artery is within normal limits for size. There is no CT evidence of acute right heart strain. The visualized aorta is normal. There is a normal 3-vessel arch branching pattern. Heart size is normal, without pericardial effusion. Mediastinum/Nodes: No mediastinal, hilar or axillary lymphadenopathy. The visualized thyroid and thoracic esophageal course are unremarkable. Lungs/Pleura: 6 mm left perifissural nodule. Subsegmental bibasilar atelectasis. No pleural effusion. Musculoskeletal: No chest wall abnormality. No  acute or significant osseous findings. Review of the MIP images confirms the above findings. CT ABDOMEN and PELVIS FINDINGS Hepatobiliary:  Low-density focus in the left hepatic lobe is likely a hepatic cyst. The liver is otherwise normal. Normal gallbladder. Pancreas: Normal contours without ductal dilatation. No peripancreatic fluid collection. Spleen: Normal. Adrenals/Urinary Tract: --Adrenal glands: Normal. --Right kidney/ureter: No hydronephrosis or perinephric stranding. No nephrolithiasis. No obstructing ureteral stones. --Left kidney/ureter: No hydronephrosis or perinephric stranding. No nephrolithiasis. No obstructing ureteral stones. --Urinary bladder: Unremarkable. Stomach/Bowel: --Stomach/Duodenum: No hiatal hernia or other gastric abnormality. Normal duodenal course and caliber. --Small bowel: The distal small bowel is dilated to the level of the ileocolonic anastomosis. There is no inflammatory change or evidence of acute bowel ischemia. There is mild free fluid in the right lower quadrant. --Colon: Status post right hemicolectomy with lower mid abdominal ileocolonic anastomosis. No leak or fluid collection at the anastomotic site. --Appendix: Surgically absent. Vascular/Lymphatic: Normal course and caliber of the major abdominal vessels. No abdominal or pelvic lymphadenopathy. Reproductive: Normal prostate and seminal vesicles. Musculoskeletal. Bilateral severe femorotibial osteoarthrosis, worse on the right. Extensive subchondral sclerosis and geodes formation within the femoral heads and acetabuli. Other: None. IMPRESSION: 1. No pulmonary embolus. 2. Dilated small bowel proximal to the ileocolonic anastomotic site, likely indicating obstruction at the anastomosis. No free intraperitoneal air or abscess. 3. Bilateral severe osteoarthrosis of the hips. 4. 6 mm nodule along the left major fissure is likely a intra ventral lymph node. Electronically Signed   By: Ulyses Jarred M.D.   On: 07/11/2017 14:14   Dg Abd Portable 1v  Result Date: 07/12/2017 CLINICAL DATA:  56 year old male with abdominal pain following right colectomy on  07/03/2017. EXAM: PORTABLE ABDOMEN - 1 VIEW COMPARISON:  CT Abdomen and Pelvis 07/11/2017 and earlier. FINDINGS: Portable AP supine view at 0939 HOURS. NG tube tip projects in the epigastrium, side hole at the level of the proximal stomach. Stable bowel gas pattern with gas-filled dilated small bowel loops up to 5 cm. Mildly improved gas in the descending colon. Excreted IV contrast in the urinary bladder. Stable visualized osseous structures. Advanced chronic changes at both hips. IMPRESSION: 1. NG tube in place, side hole up the level of the proximal stomach. 2. Continued dilated abdominal small bowel loops. Mildly improved gas in the descending colon since the CT yesterday. Electronically Signed   By: Genevie Ann M.D.   On: 07/12/2017 09:57    Anti-infectives: Anti-infectives    None       Assessment/Plan HTN HLD H/o DVT 2017, no longer on anticoagulation  Right colon polyp 2 unresectable by endoscopy S/p Laparoscopic-assisted right colectomy 9/25 Dr. Donne Hazel - discharged 9/30 and returned to ED today with SOB and serous drainage from abdominal incision - CTA negative for PE - CT abdomen/pelvis showed dilated small bowel proximal to the ileocolonic anastomotic site - WBC 5.4, TMAX 99 - NG tube with 100cc output - XR today shows decreased small bowel dilatation and contrast in colon  ID - none FEN - IVF, clamp NG/CLD VTE - SCDs, lovenox Foley - none Follow up - Dr. Donne Hazel  Plan - Xray shows decreased SB dilation and contrast in colon. Patient having bowel function. Clamp NG tube and give clear liquids. Continue ambulating.   LOS: 2 days    Wellington Hampshire , [facility] Surgery 07/13/2017, 7:37 AM Pager: [phone] Consults: [phone] Mon-Fri 7:00 am-4:30 pm Sat-Sun 7:00 am-11:30 am

## 2017-07-14 MED ORDER — BOOST / RESOURCE BREEZE PO LIQD
1.0000 | Freq: Three times a day (TID) | ORAL | Status: DC
  Administered 2017-07-15 – 2017-07-16 (×2): 1 via ORAL

## 2017-07-14 MED ORDER — SODIUM CHLORIDE 0.9 % IV SOLN
INTRAVENOUS | Status: DC
  Administered 2017-07-15 (×2): via INTRAVENOUS

## 2017-07-14 NOTE — Plan of Care (Signed)
Problem: Bowel/Gastric: Goal: Will not experience complications related to bowel motility Outcome: Not Progressing Pt C/O nausea. Pt was given zofran 4mg  IVP. Pt abd distended, decreased bowel sounds all four quad (sound like gas). Pt + passing gas. LBM=07/13/17. Pt encouraged to ambulate in room. Pt instructed to inform nurse if zofran is ineffective.

## 2017-07-14 NOTE — Progress Notes (Addendum)
Subjective No acute events. Abd xr yesterday showed improvement in SB distention. Having flatus and BMs; tolerating liquids. No complaints today, feeling well.  Objective: Vital signs in last 24 hours: Temp:  [98.7 F (37.1 C)-99.5 F (37.5 C)] 98.7 F (37.1 C) (10/06 0527) Pulse Rate:  [72-81] 74 (10/06 0527) Resp:  [16] 16 (10/05 1358) BP: (117-152)/(75-97) 117/75 (10/06 0527) SpO2:  [96 %-100 %] 96 % (10/06 0527) Last BM Date: 07/13/17  Intake/Output from previous day: 10/05 0701 - 10/06 0700 In: 120 [P.O.:120] Out: -  Intake/Output this shift: No intake/output data recorded.  Gen: NAD, comfortable CV: RRR Pulm: Normal work of breathing Abd: Soft, nontender, mildly distended; incisions without erythema; scant serous drainage from inferior aspect Ext: SCDs in place  Lab Results: CBC   Recent Labs  07/12/17 0450 07/13/17 0542  WBC 4.3 5.4  HGB 10.4* 10.9*  HCT 33.2* 34.1*  PLT 273 292   BMET  Recent Labs  07/12/17 0450 07/13/17 0542  NA 135 131*  K 4.1 3.9  CL 100* 100*  CO2 25 25  GLUCOSE 97 122*  BUN 19 16  CREATININE 1.11 1.08  CALCIUM 8.9 9.0   Assessment/Plan: Right colon polyp 2 unresectable by endoscopy S/p Laparoscopic-assisted right colectomy 9/25 Dr. Donne Hazel - discharged 9/30 and returned to ED with SOB and serous drainage from abdominal incision - CTA negative for PE - CT abdomen/pelvis showed dilated small bowel proximal to the ileocolonic anastomotic site - AF, normal wbc - NG tube out 10/5, cld started - XR 10/5 shows decreased small bowel dilatation and contrast in colon  ID - none FEN - MIVF changed to NS given hyponatremia; advance to FLD VTE - SCDs, lovenox Foley - none Follow up - Dr. Donne Hazel  Plan -Advance to FLD + boost   LOS: 3 days   Ileana Roup, MD [facility]

## 2017-07-15 LAB — BASIC METABOLIC PANEL
Anion gap: 12 (ref 5–15)
BUN: 17 mg/dL (ref 6–20)
CHLORIDE: 100 mmol/L — AB (ref 101–111)
CO2: 23 mmol/L (ref 22–32)
CREATININE: 1.05 mg/dL (ref 0.61–1.24)
Calcium: 9.3 mg/dL (ref 8.9–10.3)
GFR calc Af Amer: >60 mL/min (ref >60)
GFR calc non Af Amer: >60 mL/min (ref >60)
Glucose, Bld: 102 mg/dL — ABNORMAL HIGH (ref 65–99)
Potassium: 4.4 mmol/L (ref 3.5–5.1)
SODIUM: 135 mmol/L (ref 135–145)

## 2017-07-15 LAB — MAGNESIUM: Magnesium: 2 mg/dL (ref 1.7–2.4)

## 2017-07-15 LAB — PHOSPHORUS: Phosphorus: 3.9 mg/dL (ref 2.5–4.6)

## 2017-07-15 NOTE — Progress Notes (Signed)
   Subjective/Chief Complaint: Looks well Having BM bloated today  No nausea or vomiting    Objective: Vital signs in last 24 hours: Temp:  [98.5 F (36.9 C)-98.7 F (37.1 C)] 98.6 F (37 C) (10/07 0109) Pulse Rate:  [65-71] 65 (10/07 0621) BP: (123-133)/(80-83) 125/81 (10/07 0621) SpO2:  [99 %-100 %] 99 % (10/07 0621) Last BM Date: 07/14/17  Intake/Output from previous day: 10/06 0701 - 10/07 0700 In: 240 [P.O.:240] Out: -  Intake/Output this shift: No intake/output data recorded.  Incision/Wound:no drainage   Soft distended but soft minimally tender   Lab Results:   Recent Labs  07/13/17 0542  WBC 5.4  HGB 10.9*  HCT 34.1*  PLT 292   BMET  Recent Labs  07/13/17 0542 07/15/17 0522  NA 131* 135  K 3.9 4.4  CL 100* 100*  CO2 25 23  GLUCOSE 122* 102*  BUN 16 17  CREATININE 1.08 1.05  CALCIUM 9.0 9.3   PT/INR No results for input(s): LABPROT, INR in the last 72 hours. ABG No results for input(s): PHART, HCO3 in the last 72 hours.  Invalid input(s): PCO2, PO2  Studies/Results: No results found.  Anti-infectives: Anti-infectives    None      Assessment/Plan:  Right colon polyp 2 unresectable by endoscopy S/p Laparoscopic-assisted right colectomy 9/25 Dr. Donne Hazel - discharged 9/30 and returned to ED with SOB and serous drainage from abdominal incision - CTA negative for PE - CT abdomen/pelvis showed dilated small bowel proximal to the ileocolonic anastomotic site - AF, normal wbc - NG tube out 10/5, cld started - XR 10/5 shows decreased small bowel dilatation and contrast in colon Advance diet Ambulate  Hopefully home monday  ID - none FEN - MIVF changed to NS given hyponatremia; advance to FLD VTE - SCDs, lovenox Foley - none Follow up - Dr. Donne Hazel    LOS: 4 days    Joseph [name] A. 07/15/2017

## 2017-07-16 MED ORDER — POLYETHYLENE GLYCOL 3350 17 G PO PACK: 17.0000 g | PACK | Freq: Every day | ORAL | 3 refills | Status: DC

## 2017-07-16 NOTE — Progress Notes (Signed)
Pt discharged home with wife. AVS given to patient and reviewed in full, all questions answered to patient's satisfaction. All belongings sent with patient. VSS. BP 131/87 (BP Location: Right Arm)   Pulse 70   Temp 99 F (37.2 C) (Oral)   Resp 18   SpO2 100%

## 2017-07-16 NOTE — Discharge Instructions (Signed)
[facility]      [facility] [phone]  OPEN ABDOMINAL SURGERY: POST OP INSTRUCTIONS  Always review your discharge instruction sheet given to you by the facility where your surgery was performed.  IF YOU HAVE DISABILITY OR FAMILY LEAVE FORMS, YOU MUST BRING THEM TO THE OFFICE FOR PROCESSING.  PLEASE DO NOT GIVE THEM TO YOUR DOCTOR.  1. A prescription for pain medication may be given to you upon discharge.  Take your pain medication as prescribed, if needed.  If narcotic pain medicine is not needed, then you may take acetaminophen (Tylenol) or ibuprofen (Advil) as needed. 2. Take your usually prescribed medications unless otherwise directed. 3. If you need a refill on your pain medication, please contact your pharmacy. They will contact our office to request authorization.  Prescriptions will not be filled after 5pm or on week-ends. 4. You should follow a light diet the first few days after arrival home, such as soup and crackers, pudding, etc.unless your doctor has advised otherwise. A high-fiber, low fat diet can be resumed as tolerated.   Be sure to include lots of fluids daily. Most patients will experience some swelling and bruising on the chest and neck area.  Ice packs will help.  Swelling and bruising can take several days to resolve 5. Most patients will experience some swelling and bruising in the area of the incision. Ice pack will help. Swelling and bruising can take several days to resolve..  6. It is common to experience some constipation if taking pain medication after surgery.  Increasing fluid intake and taking a stool softener will usually help or prevent this problem from occurring.  A mild laxative (Milk of Magnesia or Miralax) should be taken according to package directions if there are no bowel movements after 48 hours. 7.  You may have steri-strips (small skin tapes) in place directly over the incision.  These strips should be left on the skin for 7-10 days.  If your  surgeon used skin glue on the incision, you may shower in 24 hours.  The glue will flake off over the next 2-3 weeks.  Any sutures or staples will be removed at the office during your follow-up visit. You may find that a light gauze bandage over your incision may keep your staples from being rubbed or pulled. You may shower and replace the bandage daily. 8. ACTIVITIES:  You may resume regular (light) daily activities beginning the next day--such as daily self-care, walking, climbing stairs--gradually increasing activities as tolerated.  You may have sexual intercourse when it is comfortable.  Refrain from any heavy lifting or straining until approved by your doctor. a. You may drive when you no longer are taking prescription pain medication, you can comfortably wear a seatbelt, and you can safely maneuver your car and apply brakes b. Return to Work: ___________________________________ 9. You should see your doctor in the office for a follow-up appointment approximately two weeks after your surgery.  Make sure that you call for this appointment within a day or two after you arrive home to insure a convenient appointment time. OTHER INSTRUCTIONS:  _____________________________________________________________ _____________________________________________________________  WHEN TO CALL YOUR DOCTOR: 1. Fever over 101.0 2. Inability to urinate 3. Nausea and/or vomiting 4. Extreme swelling or bruising 5. Continued bleeding from incision. 6. Increased pain, redness, or drainage from the incision. 7. Difficulty swallowing or breathing 8. Muscle cramping or spasms. 9. Numbness or tingling in hands or feet or around lips.  The clinic staff is available to   answer your questions during regular business hours.  Please don't hesitate to call and ask to speak to one of the nurses if you have concerns.  For further questions, please visit [email or web address]   

## 2017-07-16 NOTE — Progress Notes (Signed)
   Subjective/Chief Complaint: Having bms, having flatus, mild bloating, ambulating tol liquids   Objective: Vital signs in last 24 hours: Temp:  [98 F (36.7 C)-99.1 F (37.3 C)] 98.7 F (37.1 C) (10/08 0516) Pulse Rate:  [72-77] 73 (10/08 0516) Resp:  [16-18] 16 (10/08 0516) BP: (106-134)/(72-88) 124/88 (10/08 0516) SpO2:  [99 %-100 %] 99 % (10/08 0516) Last BM Date: 07/15/17  Intake/Output from previous day: 10/07 0701 - 10/08 0700 In: 871.3 [P.O.:720; I.V.:151.3] Out: -  Intake/Output this shift: No intake/output data recorded.  Resp: clear to auscultation bilaterally Cardio: regular rate and rhythm GI: wound clean without infection, soft mild distended bs present  Lab Results:  No results for input(s): WBC, HGB, HCT, PLT in the last 72 hours. BMET  Recent Labs  07/15/17 0522  NA 135  K 4.4  CL 100*  CO2 23  GLUCOSE 102*  BUN 17  CREATININE 1.05  CALCIUM 9.3   Assessment/Plan: Ileus s/p lap right colectomy  I think much better today, will dc home on liquids with small frequent meals taking miralax Will see back in office  [facility] 07/16/2017

## 2017-07-16 NOTE — Discharge Summary (Signed)
Physician Discharge Summary  Patient ID: Joseph [name] MRN: [id number] DOB/AGE: [birth year] 55 y.o.  Admit date: 07/11/2017 Discharge date: 07/16/2017  Admission Diagnoses: Hypertension S/p right colectomy Ileus after gi surgery  Discharge Diagnoses:  Active Problems:   Ileus following gastrointestinal surgery   Discharged Condition: good  Hospital Course: 56 yom who underwent lap right colectomy and was discharged. He was seen in office and appeared to have an ileus.  He had benign pathology on colectomy.  A CT scan showed no real abnormality except dilation to the level of near anastomosis. He has been passing flatus and having bms.  He has been eating some. His creatinine is mildly elevated.  He was placed npo and had an ng tube placed. He had contrast in the tube and this went into his colon.  He began having more regular bms and passing flatus.  He was tolerating a regular diet. His ng tube had been removed.  He was discharged home. His staples had been removed and wound has remained without infection  Consults: None  Significant Diagnostic Studies: ct scan  Treatments: IV hydration \ Disposition: 01-Home or Self Care   Allergies as of 07/16/2017   No Known Allergies     Medication List    TAKE these medications   amLODipine 10 MG tablet Commonly known as:  NORVASC Take 10 mg by mouth daily.   ibuprofen 200 MG tablet Commonly known as:  ADVIL,MOTRIN Take 200 mg by mouth daily as needed for mild pain.   lisinopril-hydrochlorothiazide 20-12.5 MG tablet Commonly known as:  PRINZIDE,ZESTORETIC TAKE 1 TABLET BY MOUTH DAILY.   oxyCODONE-acetaminophen 5-325 MG tablet Commonly known as:  ROXICET Take 1-2 tablets by mouth every 4 (four) hours as needed for severe pain.   polyethylene glycol packet Commonly known as:  MIRALAX / GLYCOLAX Take 17 g by mouth daily.   rosuvastatin 20 MG tablet Commonly known as:  CRESTOR Take 20 mg by mouth daily.       Follow-up Information    Rolm Bookbinder, MD Follow up.   Specialty:  General Surgery Contact information: [address] [place] [address] [phone]           Signed: Rolm Bookbinder 07/16/2017, 8:03 PM

## 2018-06-09 IMAGING — DX DG ABD PORTABLE 1V
1 series · 1 of 1 positions shown · non-contrast
Comparison: 07/12/2017

CLINICAL DATA: Abdominal distention.  Postop.

EXAM:
PORTABLE ABDOMEN - 1 VIEW

[abdomen kub]
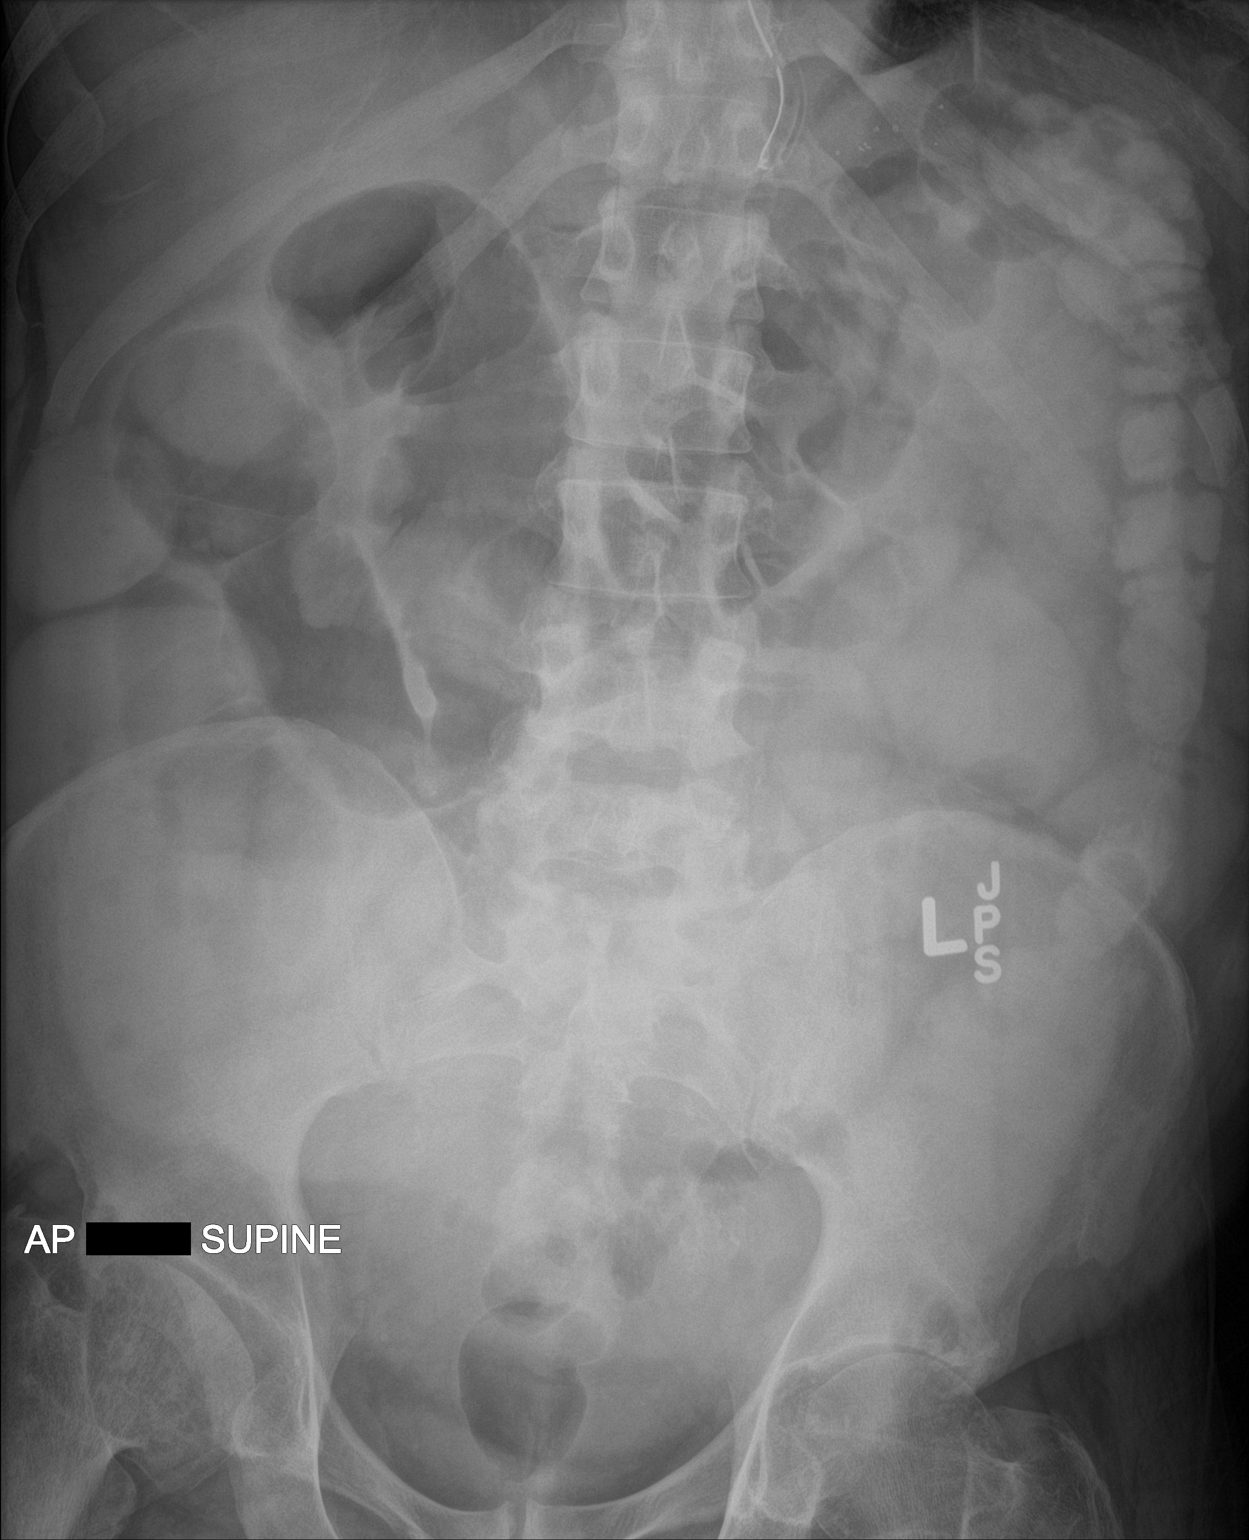

[1 of 1 positions shown; findings below may reference images not displayed]

FINDINGS: Enteric tube is unchanged with tip likely in the proximal stomach.
Small bowel dilatation has decreased from yesterday's study. Gas is
present in transverse colon. There is also a small amount of gas in
the rectum. No gross intraperitoneal free air is identified on this
supine study. Degenerative changes are noted at the hips.
IMPRESSION: Decreased small bowel dilatation.

## 2021-07-18 ENCOUNTER — Encounter: Payer: Self-pay | Admitting: Cardiology

## 2021-07-18 ENCOUNTER — Other Ambulatory Visit: Payer: Self-pay

## 2021-07-18 ENCOUNTER — Ambulatory Visit: Payer: BC Managed Care – PPO | Admitting: Cardiology

## 2021-07-18 VITALS — BP 134/80 | HR 79 | Temp 97.9°F | Resp 16 | Ht 71.0 in | Wt 232.0 lb

## 2021-07-18 DIAGNOSIS — Z01818 Encounter for other preprocedural examination: Secondary | ICD-10-CM

## 2021-07-18 DIAGNOSIS — I739 Peripheral vascular disease, unspecified: Secondary | ICD-10-CM

## 2021-07-18 DIAGNOSIS — I10 Essential (primary) hypertension: Secondary | ICD-10-CM

## 2021-07-18 MED ORDER — ASPIRIN EC 81 MG PO TBEC: 81.0000 mg | DELAYED_RELEASE_TABLET | Freq: Every day | ORAL | 3 refills | Status: DC

## 2021-07-18 NOTE — Progress Notes (Signed)
Patient referred by Kristen Loader, FNP for preop cardiac evaluation  Subjective:   Joseph [name], male    DOB: [birth year], 60 y.o.   MRN: [id number]   Chief Complaint  Patient presents with   Hypertension   Medical Clearance    RT hip surgery   New Patient (Initial Visit)     HPI  60 y.o. African-American male with hypertension, hyperlipidemia, PAD s/p atherectomy to left SFA in 2017, here for preop cardiac evaluation prior to elective surgery.  Patient was last seen by Dr. Einar Gip in 2018.  He has no complaints at this time.  Denies any claudication, also denies any angina or dyspnea symptoms.  Blood pressures well controlled.  No recent lab results available for my review.  At this time, his not on aspirin or Plavix either.  It appears that he was previously on Eliquis after diagnosis of DVT, no longer on it at this time.  And spite of his hip problems, he is able to ambulate up a flight of stairs without significant difficulty   Past Medical History:  Diagnosis Date   Asthma    child   Colon polyp    DVT (deep venous thrombosis) (HCC)    Hyperlipidemia    Hypertension    Inguinal hernia    LIH   Pilonidal cyst    Rectal bleeding    Tuberculosis    tb  dx 3-4     Past Surgical History:  Procedure Laterality Date   COLECTOMY  07/03/2017   LAPROSCOPIC    COLONOSCOPY WITH PROPOFOL N/A 01/24/2017   Procedure: COLONOSCOPY WITH PROPOFOL;  Surgeon: Wilford Corner, MD;  Location: [facility];  Service: Endoscopy;  Laterality: N/A;   FLEXIBLE SIGMOIDOSCOPY N/A 01/29/2017   Procedure: FLEXIBLE SIGMOIDOSCOPY;  Surgeon: Ronald Lobo, MD;  Location: [facility];  Service: Endoscopy;  Laterality: N/A;   INGUINAL HERNIA REPAIR  09/07/2011   Procedure: HERNIA REPAIR INGUINAL ADULT;  Surgeon: Rolm Bookbinder, MD;  Location: [place];  Service: General;  Laterality: Left;   LAPAROSCOPIC RIGHT COLECTOMY Right 07/03/2017   Procedure: LAPAROSCOPIC ASSISTED RIGHT  COLECTOMY;  Surgeon: Rolm Bookbinder, MD;  Location: [place];  Service: General;  Laterality: Right;  GENERAL AND TAP BLOCK   PERIPHERAL VASCULAR CATHETERIZATION N/A 07/25/2016   Procedure: Lower Extremity Angiography;  Surgeon: Adrian Prows, MD;  Location: [facility];  Service: Cardiovascular;  Laterality: N/A;   PILONIDAL CYST EXCISION       Social History   Tobacco Use  Smoking Status Former   Packs/day: 0.50   Years: 15.00   Pack years: 7.50   Types: Cigarettes   Quit date: 05/22/2016   Years since quitting: 5.1  Smokeless Tobacco Never    Social History   Substance and Sexual Activity  Alcohol Use Yes   Comment: 2 or 3 on weekend     History reviewed. No pertinent family history.   Current Outpatient Medications on File Prior to Visit  Medication Sig Dispense Refill   amLODipine (NORVASC) 10 MG tablet Take 10 mg by mouth daily.  1   ELIQUIS 2.5 MG TABS tablet Take 2.5 mg by mouth 2 (two) times daily.     ibuprofen (ADVIL,MOTRIN) 200 MG tablet Take 200 mg by mouth daily as needed for mild pain.     lisinopril-hydrochlorothiazide (PRINZIDE,ZESTORETIC) 20-12.5 MG tablet TAKE 1 TABLET BY MOUTH DAILY. 90 tablet 0   rosuvastatin (CRESTOR) 20 MG tablet Take 20 mg by mouth daily.  2  No current facility-administered medications on file prior to visit.    Cardiovascular and other pertinent studies:  Stress test 2019: 10.69.  No ischemia.  EF 38%.  Recommend clinical correlation.    2017: Angiographic data: Abdominal aortogram: 2 renal arteries one on either side, widely patent. Aortoiliac bifurcation widely patent.   Left femoral arteriogram with distal runoff: Left SFA occluded in the midsegment. Short segment occlusion. Mild diffuse disease.   Below the left knee, the anterior tibial is occluded. The peroneal artery has high-grade 70-80% proximal stenosis. There is mild disease in the rest of the vessel and left posterior tibial is widely patent.   Right  femoral arterial gram with distal runoff: Mild disease in the right SFA. Below the right knee there is two-vessel runoff in the form of posterior tibial and peroneal artery, these have mild disease in the proximal segment. Anterior tibial is occluded.   Interventional data: Successful PTA, directional atherectomy with a HawkOne followed by drug coated balloon angioplasty with In.Pact Admiral 6 mmx150 mm millimeter balloon, stenosis reduced from 100% to 0%.   Recommendation: Continued risk factor modification, smoking cessation, aggressive control of his lipids and hypertension. Medical therapy for small vessel disease. 165 mL contrast utilized.  EKG 07/18/2021: Sinus rhythm 54 bpm Nonspecific T wave abnormality    Recent labs: 09/07/2020: Glucose 89, BUN/Cr 13/1.0. EGFR 79. HbA1C N/A Chol 93, TG 103, HDL 35, LDL 39 TSH 1.1 normal    Review of Systems  Cardiovascular:  Negative for chest pain, dyspnea on exertion, leg swelling, palpitations and syncope.  Musculoskeletal:  Positive for joint pain.        Vitals:   07/18/21 0838  BP: 134/80  Pulse: 79  Resp: 16  Temp: 97.9 F (36.6 C)  SpO2: 98%     Body mass index is 32.36 kg/m. Filed Weights   07/18/21 0838  Weight: 232 lb (105.2 kg)     Objective:   Physical Exam Vitals and nursing note reviewed.  Constitutional:      General: He is not in acute distress. Neck:     Vascular: No JVD.  Cardiovascular:     Rate and Rhythm: Normal rate and regular rhythm.     Heart sounds: Normal heart sounds. No murmur heard. Pulmonary:     Effort: Pulmonary effort is normal.     Breath sounds: Normal breath sounds. No wheezing or rales.          Assessment & Recommendations:    60 y.o. African-American male with hypertension, hyperlipidemia, PAD s/p atherectomy to left SFA in 2017, here for preop cardiac evaluation prior to elective surgery.  Preop cardiac evaluation: Good functional capacity without any symptoms  of angina, dyspnea, claudication.  Prior equivocal stress test, but no anginal symptoms at this time.  Low cardiac risk for upcoming hip surgery.  He is currently on dual antiplatelet therapy.  Recommend using aspirin 81 mg, which can be held as required for surgery.   Thank you for referring the patient to [place]. Please feel free to contact with any questions.   Nigel Mormon, MD Pager: [phone] Office: [phone]

## 2021-09-05 ENCOUNTER — Ambulatory Visit: Payer: BC Managed Care – PPO

## 2021-09-05 ENCOUNTER — Other Ambulatory Visit: Payer: Self-pay

## 2021-09-05 ENCOUNTER — Other Ambulatory Visit: Payer: Self-pay | Admitting: Cardiology

## 2021-09-05 DIAGNOSIS — I10 Essential (primary) hypertension: Secondary | ICD-10-CM

## 2021-09-05 DIAGNOSIS — Z01818 Encounter for other preprocedural examination: Secondary | ICD-10-CM

## 2021-09-05 NOTE — Progress Notes (Signed)
Normal pumping function of the heart. No severe heart valve abnormalities noted.   Please circle back with referring surgeons. Okay to proceed with surgery.  Thanks MJP

## 2022-08-23 ENCOUNTER — Other Ambulatory Visit: Payer: Self-pay | Admitting: Cardiology

## 2022-08-23 DIAGNOSIS — Z01818 Encounter for other preprocedural examination: Secondary | ICD-10-CM

## 2022-08-23 DIAGNOSIS — I739 Peripheral vascular disease, unspecified: Secondary | ICD-10-CM

## 2022-11-22 ENCOUNTER — Other Ambulatory Visit: Payer: Self-pay | Admitting: Cardiology

## 2022-11-22 DIAGNOSIS — I739 Peripheral vascular disease, unspecified: Secondary | ICD-10-CM

## 2022-11-22 DIAGNOSIS — Z01818 Encounter for other preprocedural examination: Secondary | ICD-10-CM

## 2023-04-06 ENCOUNTER — Ambulatory Visit: Payer: BC Managed Care – PPO | Admitting: Cardiology

## 2023-04-06 ENCOUNTER — Encounter: Payer: Self-pay | Admitting: Cardiology

## 2023-04-06 VITALS — BP 150/94 | HR 62 | Ht 71.0 in | Wt 250.0 lb

## 2023-04-06 DIAGNOSIS — I10 Essential (primary) hypertension: Secondary | ICD-10-CM

## 2023-04-06 DIAGNOSIS — Z01818 Encounter for other preprocedural examination: Secondary | ICD-10-CM

## 2023-04-06 DIAGNOSIS — I739 Peripheral vascular disease, unspecified: Secondary | ICD-10-CM

## 2023-04-06 NOTE — Progress Notes (Signed)
Patient referred by Soundra Pilon, FNP for preop cardiac evaluation  Subjective:   Joseph [name], male    DOB: 05/16/1961, 62 y.o.   MRN: [id number]   Chief Complaint  Patient presents with   Pre-op Exam   Follow-up     HPI  62 y.o. African-American male with hypertension, hyperlipidemia, PAD s/p atherectomy to left SFA in 2017, here for preop cardiac evaluation prior to elective surgery.  I last saw the patient in 07/2021 for preop evaluaiton for right hip surgery.  He underwent the surgery without any preoperative cardiac issues.  Now, he is going to undergo left hip surgery.  He is able to walk a flight of stairs in spite of his hip pain, denies any chest pain, shortness of breath.  Blood pressure elevated on first check today.  Echocardiogram in 08/2021 was unremarkable.   Last office visit 07/2021: Patient was last seen by Dr. Jacinto Halim in 2018.  He has no complaints at this time.  Denies any claudication, also denies any angina or dyspnea symptoms.  Blood pressures well controlled.  No recent lab results available for my review.  At this time, his not on aspirin or Plavix either.  It appears that he was previously on Eliquis after diagnosis of DVT, no longer on it at this time.  And spite of his hip problems, he is able to ambulate up a flight of stairs without significant difficulty     Current Outpatient Medications:    amLODipine (NORVASC) 10 MG tablet, Take 10 mg by mouth daily., Disp: , Rfl: 1   aspirin EC (ASPIRIN LOW DOSE) 81 MG tablet, TAKE 1 TABLET (81 MG TOTAL) BY MOUTH DAILY. SWALLOW WHOLE.NEEDS APPOINTMENT FOR REFILLS, Disp: 90 tablet, Rfl: 3   ibuprofen (ADVIL,MOTRIN) 200 MG tablet, Take 200 mg by mouth daily as needed for mild pain., Disp: , Rfl:    lisinopril-hydrochlorothiazide (PRINZIDE,ZESTORETIC) 20-12.5 MG tablet, TAKE 1 TABLET BY MOUTH DAILY., Disp: 90 tablet, Rfl: 0   rosuvastatin (CRESTOR) 20 MG tablet, Take 20 mg by mouth daily., Disp: , Rfl:  2   Cardiovascular and other pertinent studies:  EKG 04/06/2023: Sinus rhythm 54 bpm  Echocardiogram 09/05/2021:  Left ventricle cavity is normal in size and wall thickness. Normal global  wall motion. Normal LV systolic function with EF 63%. Normal diastolic  filling pattern. Trace MR, trace TR.  No evidence of pulmonary hypertension.  No significant change compared to previous study in 2017.   Stress test 2019: 10.69 METS.  No ischemia.  EF 38%.  Recommend clinical correlation.    2017: Angiographic data: Abdominal aortogram: 2 renal arteries one on either side, widely patent. Aortoiliac bifurcation widely patent.   Left femoral arteriogram with distal runoff: Left SFA occluded in the midsegment. Short segment occlusion. Mild diffuse disease.   Below the left knee, the anterior tibial is occluded. The peroneal artery has high-grade 70-80% proximal stenosis. There is mild disease in the rest of the vessel and left posterior tibial is widely patent.   Right femoral arterial gram with distal runoff: Mild disease in the right SFA. Below the right knee there is two-vessel runoff in the form of posterior tibial and peroneal artery, these have mild disease in the proximal segment. Anterior tibial is occluded.   Interventional data: Successful PTA, directional atherectomy with a HawkOne followed by drug coated balloon angioplasty with In.Pact Admiral 6 mmx150 mm millimeter balloon, stenosis reduced from 100% to 0%.   Recommendation: Continued risk factor  modification, smoking cessation, aggressive control of his lipids and hypertension. Medical therapy for small vessel disease. 165 mL contrast utilized.  EKG 07/18/2021: Sinus rhythm 54 bpm Nonspecific T wave abnormality    Recent labs: 03/22/2023: Glucose 91, BUN/Cr 12/1.0. EGFR 79 K 3.9. Hb 13.1 HbA1C NA Chol 84, TG 71, HDL 39, LDL 29 TSH 0.9 normal  09/07/2020: Glucose 89, BUN/Cr 13/1.0. EGFR 79. HbA1C N/A Chol 93, TG 103,  HDL 35, LDL 39 TSH 1.1 normal    Review of Systems  Cardiovascular:  Negative for chest pain, dyspnea on exertion, leg swelling, palpitations and syncope.  Musculoskeletal:  Positive for joint pain.         Vitals:   04/06/23 0946 04/06/23 1004  BP: (!) 141/96 (!) 150/94  Pulse: 62 62  SpO2: 98%      Body mass index is 34.87 kg/m. Filed Weights   04/06/23 0939  Weight: 250 lb (113.4 kg)     Objective:   Physical Exam Vitals and nursing note reviewed.  Constitutional:      General: He is not in acute distress. Neck:     Vascular: No JVD.  Cardiovascular:     Rate and Rhythm: Normal rate and regular rhythm.     Heart sounds: Normal heart sounds. No murmur heard. Pulmonary:     Effort: Pulmonary effort is normal.     Breath sounds: Normal breath sounds. No wheezing or rales.  Musculoskeletal:     Right lower leg: No edema.     Left lower leg: No edema.           Assessment & Recommendations:   62 y.o. African-American male with hypertension, hyperlipidemia, PAD s/p atherectomy to left SFA in 2017, here for preop cardiac evaluation prior to elective surgery.  Preop cardiac evaluation: Fairly good functional capacity without any angina, dyspnea, claudication symptoms. Unremarkable echocardiogram 08/2021. Okay to proceed with left hip surgery with low cardiac risk.  Hypertension: Blood pressure elevated today.  Reportedly was 130 SBP at PCP visit within the last 2 weeks.  I have not made any changes to his antihypertensive medications, but encourage patient to check blood pressure regularly and send me a MyChart message in 1-2 weeks.  Based on that, would offer further recommendations.  I do not think blood pressure is prohibitively high to preclude her from proceeding with upcoming hip surgery.  PAD: No claudication symptoms.  Continue aspirin, statin.  F/u in 6 months    Elder Negus, MD Pager: [phone] Office: [phone]

## 2023-04-09 HISTORY — PX: HIP SURGERY: SHX245

## 2023-10-11 ENCOUNTER — Ambulatory Visit: Payer: Self-pay | Admitting: Cardiology

## 2023-10-17 ENCOUNTER — Encounter: Payer: Self-pay | Admitting: Cardiology

## 2023-10-17 ENCOUNTER — Ambulatory Visit: Payer: 59 | Attending: Cardiology | Admitting: Cardiology

## 2023-10-17 VITALS — BP 140/90 | HR 64 | Resp 16 | Ht 71.0 in | Wt 262.0 lb

## 2023-10-17 DIAGNOSIS — I700 Atherosclerosis of aorta: Secondary | ICD-10-CM | POA: Diagnosis not present

## 2023-10-17 DIAGNOSIS — I10 Essential (primary) hypertension: Secondary | ICD-10-CM

## 2023-10-17 NOTE — Patient Instructions (Signed)
 Medication Instructions:   STOP TAKING LISINOPRIL -hydrochlorothiazide    *If you need a refill on your cardiac medications before your next appointment, please call your pharmacy*   You have been referred to SEE OUR PHARMACIST IN HYPERTENSION CLINIC IN ONE MONTH    Follow-Up: At [facility], you and your health needs are our priority.  As part of our continuing mission to provide you with exceptional heart care, we have created designated Provider Care Teams.  These Care Teams include your primary Cardiologist (physician) and Advanced Practice Providers (APPs -  Physician Assistants and Nurse Practitioners) who all work together to provide you with the care you need, when you need it.  We recommend signing up for the patient portal called MyChart.  Sign up information is provided on this After Visit Summary.  MyChart is used to connect with patients for Virtual Visits (Telemedicine).  Patients are able to view lab/test results, encounter notes, upcoming appointments, etc.  Non-urgent messages can be sent to your provider as well.   To learn more about what you can do with MyChart, go to [email or web address].    Your next appointment:   1 year(s)  Provider:   DR. PATWARDHAN

## 2023-10-17 NOTE — Progress Notes (Signed)
  Cardiology Office Note:  .   Date:  10/17/2023  ID:  Joseph [name], DOB [birth year], MRN [id number] PCP: Marvene Prentice SAUNDERS, FNP  [facility] Providers Cardiologist:  Newman Jushua, MD PCP: Marvene Prentice SAUNDERS, FNP  Chief Complaint  Patient presents with   Hypertension   Aortic atherosclerosis   PAD        Follow-up    1 year      History of Present Illness: .    Joseph [name] is a 63 y.o. male with hypertension, hyperlipidemia, PAD s/p atherectomy to left SFA in 2017  Patient is doing well, denies recurrence of chest pain or shortness of breath.  Blood pressure elevated today.  He states that he has not taken his medications this morning.  Blood pressure was 130/80 mmHg during PCP visit, according to the patient.  Since his last stated me, he underwent hip surgery without any perioperative cardiac events.  Vitals:   10/17/23 0938  BP: (!) 140/90  Pulse: 64  Resp: 16  SpO2: 97%     ROS:  Review of Systems  Cardiovascular:  Negative for chest pain, dyspnea on exertion, leg swelling, palpitations and syncope.     Studies Reviewed: SABRA        EKG 10/17/2023: Sinus rhythm 57 bpm When compared with ECG of 11-Jul-2017 10:54, No significant change was found    Independently interpreted 03/2023: Chol 84, TG 71, HDL 39, LDL 29 Hb 13.1 Cr 1.1   Physical Exam:   Physical Exam Vitals and nursing note reviewed.  Constitutional:      General: He is not in acute distress. Neck:     Vascular: No JVD.  Cardiovascular:     Rate and Rhythm: Normal rate and regular rhythm.     Heart sounds: Normal heart sounds. No murmur heard. Pulmonary:     Effort: Pulmonary effort is normal.     Breath sounds: Normal breath sounds. No wheezing or rales.  Musculoskeletal:     Right lower leg: No edema.     Left lower leg: No edema.      VISIT DIAGNOSES:   ICD-10-CM   1. Aortic atherosclerosis (HCC)  I70.0 EKG 12-Lead    AMB Referral to [facility]     2. Primary hypertension  I10 AMB Referral to [facility] Pharm[facility]       ASSESSMENT AND PLAN: .    Joseph [name] is a 63 y.o. male with ypertension, hyperlipidemia, PAD s/p atherectomy to left SFA in 2017   Hypertension: Blood pressure remains suboptimal.  I have encouraged him to keep a log of his blood pressure readings and follow-up with our pharmacist in 1 month.  If SBP remains >140 mmHg at that time, could consider increasing losartan/hydrochlorothiazide  dose to 100/25, or adding additional medication like hydralazine  or labetalol.    PAD: No claudication symptoms.  Continue aspirin , statin.       F/u in 1 year  Signed, Newman JINNY Marlon, MD

## 2023-11-26 ENCOUNTER — Ambulatory Visit: Payer: 59 | Attending: Cardiology | Admitting: Pharmacist

## 2023-11-26 VITALS — BP 132/90 | HR 65

## 2023-11-26 DIAGNOSIS — I10 Essential (primary) hypertension: Secondary | ICD-10-CM | POA: Diagnosis not present

## 2023-11-26 NOTE — Patient Instructions (Signed)
 Your blood pressure goal is < 130/49mmHg   Please try to increase physical activity. Try going for a walk before work. Strength exercises are also great Limit alcohol to no more than 2 drinks per occasion Continue to check blood pressure at home. Please bring in home blood pressure monitor to next appointment.  Important lifestyle changes to control high blood pressure  Intervention  Effect on the BP   Weight loss Weight loss is one of the most effective lifestyle changes for controlling blood pressure. If you're overweight or obese, losing even a small amount of weight can help reduce blood pressure.    Blood pressure can decrease by 1 millimeter of mercury (mmHg) with each kilogram (about 2.2 pounds) of weight lost.   Exercise regularly As a general goal, aim for 30 minutes of moderate physical activity every day.    Regular physical activity can lower blood pressure by 5 - 8 mmHg.   Eat a healthy diet Eat a diet rich in whole grains, fruits, vegetables, lean meat, and low-fat dairy products. Limit processed foods, saturated fat, and sweets.    A heart-healthy diet can lower high blood pressure by 10 mmHg.   Reduce salt (sodium) in your diet Aim for 000mg  of sodium each day. Avoid deli meats, canned food, and frozen microwave meals which are high in sodium.     Limiting sodium can reduce blood pressure by 5 mmHg.   Limit alcohol One drink equals 12 ounces of beer, 5 ounces of wine, or 1.5 ounces of 80-proof liquor.    Limiting alcohol to < 1 drink a day for women or < 2 drinks a day for men can help lower blood pressure by about 4 mmHg.   To check your pressure at home you will need to:   Sit up in a chair, with feet flat on the floor and back supported. Do not cross your ankles or legs. Rest your left arm so that the cuff is about heart level. If the cuff goes on your upper arm, then just relax your arm on the table, arm of the chair, or your lap. If you have a wrist  cuff, hold your wrist against your chest at heart level. Place the cuff snugly around your arm, about 1 inch above the crease of your elbow. The cords should be inside the groove of your elbow.  Sit quietly, with the cuff in place, for about 5 minutes. Then press the power button to start a reading. Do not talk or move while the reading is taking place.  Record your readings on a sheet of paper. Although most cuffs have a memory, it is often easier to see a pattern developing when the numbers are all in front of you.  You can repeat the reading after 1-3 minutes if it is recommended.   Make sure your bladder is empty and you have not had caffeine or tobacco within the last 30 minutes   Always bring your blood pressure log with you to your appointments. If you have not brought your monitor in to be double checked for accuracy, please bring it to your next appointment.   You can find a list of validated (accurate) blood pressure cuffs at: validatebp.org

## 2023-11-26 NOTE — Assessment & Plan Note (Addendum)
 Assessment: Blood pressure in clinic is above goal of less than 130/80 His home readings recently have been slightly above goal as well Patient reports previously his losartan hydrochlorothiazide dose was higher but had to be reduced due to dizziness Reports that he tries to avoid salt Very little to no caffeine No formal exercise but does have a more active job We discussed potentially increasing either his losartan or his hydrochlorothiazide versus increasing his physical activity  Plan: Patient wishes to try to improve lifestyle first We will focus on exercise and decrease alcohol intake Follow-up in a month and a half. if blood pressure is not consistently at goal at that time we will need to talk about increasing pharmacotherapy I have asked him to bring his home blood pressure cuff to next appointment to verify accuracy

## 2023-11-26 NOTE — Progress Notes (Signed)
 Patient ID: Joseph [name]                 DOB: 02-Mar-1961                      MRN: [id number]      HPI: Joseph [name] is a 63 y.o. male referred by Dr. Rosemary Holms to HTN clinic. PMH is significant for hypertension, hyperlipidemia, PAD s/p atherectomy to left SFA in 2017.  Patient seen by Dr. Rosemary Holms on 10/17/2023.  His blood pressure was 140/90.  Patient reported he had not taken his blood pressure medicines yet.  He was asked to check his blood pressure at home and follow-up with Pharm.D. to assure appropriate control.  Latest labs: 09/29/23 Scr 1.12, K4.1 Na 141, Ca cor 9.44  Patient presents today to HTN clinic. He brings in a list of home BP readings. The beginning of Jan readings are consistently <130/80 but February readings are in the mid 130s to 140s over 70s to 61s.  Patient denies any dizziness, lightheadedness, headaches, blurred vision. He does have a cold and recently reports taking Alka-Seltzer cold and flu or NyQuil.  He was advised to avoid anything that has pseudoephedrine or phenylephrine in it.  Reports drinking just a few beers on the weekends.  No formal exercise.  Works part-time in custodial work.   Current HTN meds: Amlodipine 10 mg daily, losartan/hydrochlorothiazide 50/12.5 daily Previously tried:  BP goal: Less than 130/80  Family History: No family history on file.  Social History: no tobacco, ETOH on weekends- few beers per patient report  Diet: baked pork chops, salmon, chicken Doesn't eat a lot of salt per patient No soda, no coffee Drinks a lot of water  Exercise:  Yard work, house work Custodial work  Home BP readings:  126 75  123 75  133 80  129 75  129 81  137 75  136 76  136 79  140 72  133 77  142 80  131 85  135  87  137 79  138 88  [id number]     Wt Readings from Last 3 Encounters:  10/17/23 262 lb (118.8 kg)  04/06/23 250 lb (113.4 kg)  07/18/21 232 lb (105.2 kg)   BP Readings from Last 3  Encounters:  11/26/23 (!) 132/90  10/17/23 (!) 140/90  04/06/23 (!) 150/94   Pulse Readings from Last 3 Encounters:  11/26/23 65  10/17/23 64  04/06/23 62    Renal function: CrCl cannot be calculated (Patient's most recent lab result is older than the maximum 21 days allowed.).  Past Medical History:  Diagnosis Date   Asthma    child   Colon polyp    DVT (deep venous thrombosis) (HCC)    Hyperlipidemia    Hypertension    Inguinal hernia    LIH   Pilonidal cyst    Rectal bleeding    Tuberculosis    tb  dx 3-4    Current Outpatient Medications on File Prior to Visit  Medication Sig Dispense Refill   amLODipine (NORVASC) 10 MG tablet Take 10 mg by mouth daily.  1   aspirin EC (ASPIRIN LOW DOSE) 81 MG tablet TAKE 1 TABLET (81 MG TOTAL) BY MOUTH DAILY. SWALLOW WHOLE.NEEDS APPOINTMENT FOR REFILLS 90 tablet 3   ibuprofen (ADVIL,MOTRIN) 200 MG tablet Take 200 mg by mouth daily as needed for mild pain.     losartan-hydrochlorothiazide (HYZAAR) 50-12.5 MG tablet Take 1 tablet by  mouth daily.     rosuvastatin (CRESTOR) 20 MG tablet Take 20 mg by mouth daily.  2   sildenafil (VIAGRA) 50 MG tablet Take 50 mg by mouth as needed.     No current facility-administered medications on file prior to visit.    No Known Allergies  Blood pressure (!) 132/90, pulse 65.   Assessment/Plan: HYPERTENSION CONTROL Vitals:   11/26/23 1059 11/26/23 1104  BP: (!) 150/90 (!) 132/90    The patient's blood pressure is elevated above target today.  In order to address the patient's elevated BP: Blood pressure will be monitored at home to determine if medication changes need to be made.      1. Hypertension -  Primary hypertension Assessment: Blood pressure in clinic is above goal of less than 130/80 His home readings recently have been slightly above goal as well Patient reports previously his losartan hydrochlorothiazide dose was higher but had to be reduced due to dizziness Reports  that he tries to avoid salt Very little to no caffeine No formal exercise but does have a more active job We discussed potentially increasing either his losartan or his hydrochlorothiazide versus increasing his physical activity  Plan: Patient wishes to try to improve lifestyle first We will focus on exercise and decrease alcohol intake Follow-up in a month and a half. if blood pressure is not consistently at goal at that time we will need to talk about increasing pharmacotherapy I have asked him to bring his home blood pressure cuff to next appointment to verify accuracy    Thank you  Olene Floss, Pharm.D, BCACP, CPP [facility] A Division of [facility] [address], [place], [place] [address]  Phone: [phone]; Fax: [phone]

## 2024-01-15 ENCOUNTER — Ambulatory Visit: Payer: 59 | Admitting: Pharmacist

## 2024-01-15 NOTE — Assessment & Plan Note (Signed)
 Assessment: Patient's blood pressure has been... Home BPs at / not at goal of < 130/80 mmHg Vitals today: BP .Marland Kitchen mmHg, HR .Marland Kitchen Candidate for intensified hypertension regimen with titration / addition of ...  Plan: Advised patient to check daily blood pressures at home and report back with readings

## 2024-01-15 NOTE — Progress Notes (Unsigned)
 Patient ID: Joseph [name]                 DOB: May 07, 1961                      MRN: [id number]      HPI: Joseph [name] is a 63 y.o. male referred by Dr. Rosemary Holms to HTN clinic. PMH is significant for hypertension, hyperlipidemia, PAD s/p atherectomy to left SFA in 2017.  Patient seen by Dr. Rosemary Holms on 10/17/2023.  His blood pressure was 140/90.  Patient reported he had not taken his blood pressure medicines yet.  He was asked to check his blood pressure at home and follow-up with Pharm.D. to assure appropriate control.  Latest labs: 09/29/23 Scr 1.12, K4.1 Na 141, Ca cor 9.44  At last pharmacy visit 11/26/23, Home readings in January were consistently <130/80 but February readings were in the mid 130s-140s/70s-80s. Patient denies any dizziness, lightheadedness, headaches, blurred vision. He does have a cold and recently reports taking Alka-Seltzer cold and flu or NyQuil.  He was advised to avoid anything that has pseudoephedrine or phenylephrine in it.  Reports drinking just a few beers on the weekends. No formal exercise. Works part-time in custodial work. BP in clinic was 132/90. No changes made, patient preference to improve lifestyle first with focus on exercise and reducing alcohol intake.   At today's visit: Lifestyle changes - alcohol (was doing a few beers on weekends before, any reduction?), formal exercise?  Validate BP cuff Home readings? Symptoms? BP in clinic Potentially adding therapy - increase hyzaar vs adding spiro  Current HTN meds: Amlodipine 10 mg daily, losartan/hydrochlorothiazide 50/12.5 daily Previously tried:  BP goal: Less than 130/80  Family History: No family history on file.  Social History: Former smoker, quit 2017, 7.5 pack year hsitory. No current tobacco, ETOH on weekends- few beers per patient report  Diet: baked pork chops, salmon, chicken Doesn't eat a lot of salt per patient No soda, no coffee Drinks a lot of water  Exercise:   Yard work, house work Custodial work  Home BP readings:     Wt Readings from Last 3 Encounters:  10/17/23 262 lb (118.8 kg)  04/06/23 250 lb (113.4 kg)  07/18/21 232 lb (105.2 kg)   BP Readings from Last 3 Encounters:  11/26/23 (!) 132/90  10/17/23 (!) 140/90  04/06/23 (!) 150/94   Pulse Readings from Last 3 Encounters:  11/26/23 65  10/17/23 64  04/06/23 62    Renal function: CrCl cannot be calculated (Patient's most recent lab result is older than the maximum 21 days allowed.).  Past Medical History:  Diagnosis Date   Asthma    child   Colon polyp    DVT (deep venous thrombosis) (HCC)    Hyperlipidemia    Hypertension    Inguinal hernia    LIH   Pilonidal cyst    Rectal bleeding    Tuberculosis    tb  dx 3-4    Current Outpatient Medications on File Prior to Visit  Medication Sig Dispense Refill   amLODipine (NORVASC) 10 MG tablet Take 10 mg by mouth daily.  1   aspirin EC (ASPIRIN LOW DOSE) 81 MG tablet TAKE 1 TABLET (81 MG TOTAL) BY MOUTH DAILY. SWALLOW WHOLE.NEEDS APPOINTMENT FOR REFILLS 90 tablet 3   ibuprofen (ADVIL,MOTRIN) 200 MG tablet Take 200 mg by mouth daily as needed for mild pain.     losartan-hydrochlorothiazide (HYZAAR) 50-12.5 MG tablet Take 1  tablet by mouth daily.     rosuvastatin (CRESTOR) 20 MG tablet Take 20 mg by mouth daily.  2   sildenafil (VIAGRA) 50 MG tablet Take 50 mg by mouth as needed.     No current facility-administered medications on file prior to visit.    No Known Allergies  There were no vitals taken for this visit.   Assessment/Plan: No BP recorded.  {Refresh Note OR Click here to enter BP  :1}***   1. Hypertension -  Primary hypertension Assessment: Patient's blood pressure has been... Home BPs at / not at goal of < 130/80 mmHg Vitals today: BP .Marland Kitchen mmHg, HR .Marland Kitchen Candidate for intensified hypertension regimen with titration / addition of ...  Plan: Advised patient to check daily blood pressures at home and  report back with readings   Thank you,  Lendon Ka, PharmD Candidate 2025 [facility] 01/16/24  Olene Floss, Pharm.D, BCACP, CPP [facility] A Division of [facility] [address], [place], [place] [address]  Phone: [phone]; Fax: [phone]

## 2024-01-16 ENCOUNTER — Encounter: Payer: Self-pay | Admitting: Pharmacist

## 2024-01-16 ENCOUNTER — Ambulatory Visit: Payer: 59 | Attending: Cardiology | Admitting: Pharmacist

## 2024-01-16 VITALS — BP 144/92

## 2024-01-16 DIAGNOSIS — I10 Essential (primary) hypertension: Secondary | ICD-10-CM

## 2024-01-16 NOTE — Patient Instructions (Signed)
 Continue to increase physical activity and incorporate strength training exercises into your routine, like lifting weights (the weight does not matter!) or body-weight exercises like squats.   Please purchase a new blood pressure cuff. A reputable brand is Omron, which may be found at [facility], [employer], or any of the [employer]. Please continue to check blood pressure once daily.   Your blood pressure goal is < 130/63mmHg   Important lifestyle changes to control high blood pressure  Intervention  Effect on the BP   Weight loss Weight loss is one of the most effective lifestyle changes for controlling blood pressure. If you're overweight or obese, losing even a small amount of weight can help reduce blood pressure.    Blood pressure can decrease by 1 millimeter of mercury (mmHg) with each kilogram (about 2.2 pounds) of weight lost.   Exercise regularly As a general goal, aim for 30 minutes of moderate physical activity every day.    Regular physical activity can lower blood pressure by 5 - 8 mmHg.   Eat a healthy diet Eat a diet rich in whole grains, fruits, vegetables, lean meat, and low-fat dairy products. Limit processed foods, saturated fat, and sweets.    A heart-healthy diet can lower high blood pressure by 10 mmHg.   Reduce salt (sodium) in your diet Aim for 000mg  of sodium each day. Avoid deli meats, canned food, and frozen microwave meals which are high in sodium.     Limiting sodium can reduce blood pressure by 5 mmHg.   Limit alcohol One drink equals 12 ounces of beer, 5 ounces of wine, or 1.5 ounces of 80-proof liquor.    Limiting alcohol to < 1 drink a day for women or < 2 drinks a day for men can help lower blood pressure by about 4 mmHg.   To check your pressure at home you will need to:   Sit up in a chair, with feet flat on the floor and back supported. Do not cross your ankles or legs. Rest your left arm so that the cuff is about heart level. If  the cuff goes on your upper arm, then just relax your arm on the table, arm of the chair, or your lap. If you have a wrist cuff, hold your wrist against your chest at heart level. Place the cuff snugly around your arm, about 1 inch above the crease of your elbow. The cords should be inside the groove of your elbow.  Sit quietly, with the cuff in place, for about 5 minutes. Then press the power button to start a reading. Do not talk or move while the reading is taking place.  Record your readings on a sheet of paper. Although most cuffs have a memory, it is often easier to see a pattern developing when the numbers are all in front of you.  You can repeat the reading after 1-3 minutes if it is recommended.   Make sure your bladder is empty and you have not had caffeine or tobacco within the last 30 minutes   Always bring your blood pressure log with you to your appointments. If you have not brought your monitor in to be double checked for accuracy, please bring it to your next appointment.   You can find a list of validated (accurate) blood pressure cuffs at: validatebp.org

## 2024-03-17 ENCOUNTER — Ambulatory Visit: Attending: Internal Medicine | Admitting: Pharmacist

## 2024-03-17 VITALS — BP 138/90 | HR 62

## 2024-03-17 DIAGNOSIS — I10 Essential (primary) hypertension: Secondary | ICD-10-CM | POA: Diagnosis not present

## 2024-03-17 NOTE — Patient Instructions (Addendum)
 Please continue amlodipine  10 mg daily, losartan/hydrochlorothiazide  50/12.5 daily  Continue to increase your physical activity Please call me if blood pressure is >130/80 frequently

## 2024-03-17 NOTE — Assessment & Plan Note (Signed)
 Assessment: Blood pressure in clinic today above goal of less than 130/80 Home readings much better controlled.  Some diastolic above goal but average 124/80 Denies any dizziness lightheadedness headaches or blurred vision Working on increasing his physical activity Appears to be some component of weight coat hypertension as home readings are generally lower than clinic readings  Plan: Continue amlodipine  10 mg daily and losartan/HCTZ 50/12.5 mg daily Follow-up as needed if blood pressure consistently greater than 130/80

## 2024-03-17 NOTE — Progress Notes (Signed)
 Patient ID: Joseph [name]                 DOB: December 23, 1960                      MRN: [id number]      HPI: Joseph [name] is a 63 y.o. male referred by Dr. Filiberto Hug to HTN clinic. PMH is significant for hypertension, hyperlipidemia, PAD s/p atherectomy to left SFA in 2017.  Patient seen by Dr. Filiberto Hug on 10/17/2023.  His blood pressure was 140/90.  Patient reported he had not taken his blood pressure medicines yet.  He was asked to check his blood pressure at home and follow-up with Pharm.D. to assure appropriate control.  Latest labs: 09/29/23 Scr 1.12, K4.1 Na 141, Ca cor 9.44  At last pharmacy visit 11/26/23, Home readings in January were consistently <130/80 but February readings were in the mid 130s-140s/70s-80s. He reported drinking just a few beers on the weekends. No formal exercise. Works part-time in custodial work. BP in clinic was 132/90. No changes made, patient preference to improve lifestyle first with focus on exercise and reducing alcohol intake.   At last visit 01/16/24 blood pressure was 144/92. Home blood pressure cuff found to be inaccurate. He was asked to purchase a new one and to continue to monitor at home.  Patient presents today for follow-up.  Home blood pressure averaging 124/80.  He did purchase a new blood pressure cuff.  States that he is trying to be more active and walk more.  Reports compliance with medications.  Current HTN meds: Amlodipine  10 mg daily, losartan/hydrochlorothiazide  50/12.5 daily Previously tried:  BP goal: Less than 130/80  Family History: No family history on file.  Social History: Former smoker, quit 2017, 7.5 pack year hsitory. No current tobacco, ETOH on weekends- few beers per patient report  Diet: baked pork chops, salmon, chicken, lots of salads Doesn't eat a lot of salt per patient No soda, no coffee Drinks a lot of water  Exercise:  Yard work, house work Custodial work [employer], walking, stretches - mobility  limited by double hip replacement surgeries in 2024 Trying to walk more  Home BP readings: but cuff not accurate 123/87, 125/77, 124/81, 125/78, 127/77, 120/82, 128/84, 125/82, 127/89, 127/73, 116/78, 122/75 Average 124/80   Wt Readings from Last 3 Encounters:  10/17/23 262 lb (118.8 kg)  04/06/23 250 lb (113.4 kg)  07/18/21 232 lb (105.2 kg)   BP Readings from Last 3 Encounters:  03/17/24 (!) 138/90  01/16/24 (!) 144/92  11/26/23 (!) 132/90   Pulse Readings from Last 3 Encounters:  03/17/24 62  11/26/23 65  10/17/23 64    Renal function: CrCl cannot be calculated (Patient's most recent lab result is older than the maximum 21 days allowed.).  Past Medical History:  Diagnosis Date   Asthma    child   Colon polyp    DVT (deep venous thrombosis) (HCC)    Hyperlipidemia    Hypertension    Inguinal hernia    LIH   Pilonidal cyst    Rectal bleeding    Tuberculosis    tb  dx 3-4    Current Outpatient Medications on File Prior to Visit  Medication Sig Dispense Refill   amLODipine  (NORVASC ) 10 MG tablet Take 10 mg by mouth daily.  1   aspirin  EC (ASPIRIN  LOW DOSE) 81 MG tablet TAKE 1 TABLET (81 MG TOTAL) BY MOUTH DAILY. SWALLOW WHOLE.NEEDS APPOINTMENT FOR REFILLS 90  tablet 3   losartan-hydrochlorothiazide  (HYZAAR) 50-12.5 MG tablet Take 1 tablet by mouth daily.     rosuvastatin  (CRESTOR ) 20 MG tablet Take 20 mg by mouth daily.  2   sildenafil (VIAGRA) 50 MG tablet Take 50 mg by mouth as needed.     No current facility-administered medications on file prior to visit.    No Known Allergies  Blood pressure (!) 138/90, pulse 62.   Assessment/Plan: HYPERTENSION CONTROL Vitals:   03/17/24 1013 03/17/24 1014  BP: (!) 134/96 (!) 138/90    The patient's blood pressure is elevated above target today.  In order to address the patient's elevated BP: The blood pressure is usually elevated in clinic.  Blood pressures monitored at home have been optimal.      1.  Hypertension -  Primary hypertension Assessment: Blood pressure in clinic today above goal of less than 130/80 Home readings much better controlled.  Some diastolic above goal but average 124/80 Denies any dizziness lightheadedness headaches or blurred vision Working on increasing his physical activity Appears to be some component of weight coat hypertension as home readings are generally lower than clinic readings  Plan: Continue amlodipine  10 mg daily and losartan/HCTZ 50/12.5 mg daily Follow-up as needed if blood pressure consistently greater than 130/80    Thank you,  Zaryan Yakubov D Rayquon Uselman, Pharm.D, BCACP, CPP [facility] Division of [facility] [address] [place], [place] [address]  Phone: [phone]; Fax: [phone]

## 2024-09-02 ENCOUNTER — Encounter: Payer: Self-pay | Admitting: Cardiology
